# Patient Record
Sex: Male | Born: 1955 | Race: White | Hispanic: Yes | Marital: Married | Smoking: Former smoker
Health system: Southern US, Community
[De-identification: ages and names within clinical notes are randomized; demographics above are authoritative.]

## PROBLEM LIST (undated history)

## (undated) DIAGNOSIS — J45909 Unspecified asthma, uncomplicated: Secondary | ICD-10-CM

## (undated) DIAGNOSIS — F32A Depression, unspecified: Secondary | ICD-10-CM

## (undated) DIAGNOSIS — M1990 Unspecified osteoarthritis, unspecified site: Secondary | ICD-10-CM

## (undated) DIAGNOSIS — I509 Heart failure, unspecified: Secondary | ICD-10-CM

## (undated) DIAGNOSIS — I350 Nonrheumatic aortic (valve) stenosis: Secondary | ICD-10-CM

## (undated) DIAGNOSIS — E785 Hyperlipidemia, unspecified: Secondary | ICD-10-CM

## (undated) DIAGNOSIS — I10 Essential (primary) hypertension: Secondary | ICD-10-CM

## (undated) DIAGNOSIS — G4730 Sleep apnea, unspecified: Secondary | ICD-10-CM

## (undated) DIAGNOSIS — R011 Cardiac murmur, unspecified: Secondary | ICD-10-CM

## (undated) DIAGNOSIS — K219 Gastro-esophageal reflux disease without esophagitis: Secondary | ICD-10-CM

## (undated) HISTORY — DX: Essential (primary) hypertension: I10

## (undated) HISTORY — PX: CHOLECYSTECTOMY: SHX55

## (undated) HISTORY — DX: Morbid (severe) obesity due to excess calories: E66.01

## (undated) HISTORY — DX: Nonrheumatic aortic (valve) stenosis: I35.0

## (undated) HISTORY — PX: APPENDECTOMY: SHX54

## (undated) HISTORY — DX: Cardiac murmur, unspecified: R01.1

## (undated) HISTORY — DX: Sleep apnea, unspecified: G47.30

## (undated) HISTORY — DX: Hyperlipidemia, unspecified: E78.5

---

## 2005-10-08 ENCOUNTER — Ambulatory Visit: Payer: Self-pay | Admitting: Internal Medicine

## 2006-08-19 ENCOUNTER — Observation Stay: Payer: Self-pay | Admitting: Internal Medicine

## 2006-08-19 ENCOUNTER — Other Ambulatory Visit: Payer: Self-pay

## 2006-10-26 ENCOUNTER — Ambulatory Visit: Payer: Self-pay | Admitting: Gastroenterology

## 2007-11-23 ENCOUNTER — Ambulatory Visit: Payer: Self-pay | Admitting: Family Medicine

## 2010-02-10 ENCOUNTER — Ambulatory Visit: Payer: Self-pay

## 2011-09-13 DIAGNOSIS — E785 Hyperlipidemia, unspecified: Secondary | ICD-10-CM | POA: Insufficient documentation

## 2011-09-13 DIAGNOSIS — B353 Tinea pedis: Secondary | ICD-10-CM | POA: Insufficient documentation

## 2011-09-13 DIAGNOSIS — E669 Obesity, unspecified: Secondary | ICD-10-CM | POA: Insufficient documentation

## 2011-09-13 DIAGNOSIS — I10 Essential (primary) hypertension: Secondary | ICD-10-CM | POA: Insufficient documentation

## 2011-09-14 DIAGNOSIS — E559 Vitamin D deficiency, unspecified: Secondary | ICD-10-CM | POA: Insufficient documentation

## 2012-05-10 DIAGNOSIS — M1990 Unspecified osteoarthritis, unspecified site: Secondary | ICD-10-CM | POA: Insufficient documentation

## 2012-10-06 DIAGNOSIS — L989 Disorder of the skin and subcutaneous tissue, unspecified: Secondary | ICD-10-CM | POA: Insufficient documentation

## 2013-03-05 DIAGNOSIS — M5416 Radiculopathy, lumbar region: Secondary | ICD-10-CM | POA: Insufficient documentation

## 2013-09-03 DIAGNOSIS — R011 Cardiac murmur, unspecified: Secondary | ICD-10-CM | POA: Insufficient documentation

## 2013-09-10 DIAGNOSIS — E119 Type 2 diabetes mellitus without complications: Secondary | ICD-10-CM | POA: Insufficient documentation

## 2014-05-29 DIAGNOSIS — F3341 Major depressive disorder, recurrent, in partial remission: Secondary | ICD-10-CM | POA: Insufficient documentation

## 2017-03-09 ENCOUNTER — Ambulatory Visit: Payer: Medicaid Other | Attending: Neurology

## 2017-03-09 DIAGNOSIS — G4733 Obstructive sleep apnea (adult) (pediatric): Secondary | ICD-10-CM | POA: Insufficient documentation

## 2017-03-09 DIAGNOSIS — I10 Essential (primary) hypertension: Secondary | ICD-10-CM | POA: Insufficient documentation

## 2017-03-09 DIAGNOSIS — Z1389 Encounter for screening for other disorder: Secondary | ICD-10-CM | POA: Diagnosis present

## 2017-03-09 DIAGNOSIS — I252 Old myocardial infarction: Secondary | ICD-10-CM | POA: Insufficient documentation

## 2017-03-09 DIAGNOSIS — F329 Major depressive disorder, single episode, unspecified: Secondary | ICD-10-CM | POA: Diagnosis not present

## 2017-03-09 DIAGNOSIS — F5101 Primary insomnia: Secondary | ICD-10-CM | POA: Insufficient documentation

## 2017-03-09 DIAGNOSIS — G4730 Sleep apnea, unspecified: Secondary | ICD-10-CM | POA: Diagnosis present

## 2017-03-16 ENCOUNTER — Ambulatory Visit: Payer: Medicaid Other | Attending: Neurology

## 2017-03-16 DIAGNOSIS — G4733 Obstructive sleep apnea (adult) (pediatric): Secondary | ICD-10-CM | POA: Diagnosis not present

## 2017-03-16 DIAGNOSIS — F5101 Primary insomnia: Secondary | ICD-10-CM | POA: Diagnosis not present

## 2017-03-16 DIAGNOSIS — E119 Type 2 diabetes mellitus without complications: Secondary | ICD-10-CM | POA: Diagnosis not present

## 2017-09-13 ENCOUNTER — Encounter: Payer: Self-pay | Admitting: Cardiovascular Disease

## 2017-09-13 ENCOUNTER — Ambulatory Visit (INDEPENDENT_AMBULATORY_CARE_PROVIDER_SITE_OTHER): Payer: Medicaid Other | Admitting: Cardiovascular Disease

## 2017-09-13 VITALS — BP 126/76 | HR 76 | Ht 70.0 in | Wt 365.0 lb

## 2017-09-13 DIAGNOSIS — E785 Hyperlipidemia, unspecified: Secondary | ICD-10-CM | POA: Diagnosis not present

## 2017-09-13 DIAGNOSIS — I509 Heart failure, unspecified: Secondary | ICD-10-CM | POA: Diagnosis not present

## 2017-09-13 DIAGNOSIS — I10 Essential (primary) hypertension: Secondary | ICD-10-CM | POA: Diagnosis not present

## 2017-09-13 DIAGNOSIS — R011 Cardiac murmur, unspecified: Secondary | ICD-10-CM

## 2017-09-13 NOTE — Progress Notes (Signed)
  Cardiology Office Note   Date:  09/13/2017   ID:  Andre Erickson, DOB 11/22/1955, MRN [id number]  PCP:  Markly, Linda, PA  Cardiologist:   Muhammad Arida, MD   Chief Complaint  Patient presents with  . New Patient (Initial Visit)    urgent referral per [facility] murmur angina per daughter BLE swelling and weaping. Meds reviewed verbally with patient,.       History of Present Illness: Andre Erickson is a 62 y.o. male who was referred by Dr. Elena Adamo for evaluation of shortness of breath, leg edema and a cardiac murmur.  The patient is originally from [place] and speaks some English but his wife is present and helps with translation.  He has known history of morbid obesity, essential hypertension, hyperlipidemia and sleep apnea with poor intolerance to CPAP.  There is no family history of premature coronary artery disease although his father does have A. fib and had coronary artery disease later in life. According to the patient and his wife, he was hospitalized at [facility] in 2008 with what seems to be cardiac arrest from the description.  At that time he was seen by Dr. Kowalski and cardiac catheterization was recommended.  However, the patient declined after he heard about the risks and benefits.  Since then, the patient has been managed medically and has not been seen by cardiology.  Over the last year, he has experienced worsening exertional dyspnea which is currently happening with minimal activities.  He also had worsening leg edema and was started on furosemide 4 months ago with some improvement.  He occasionally forms blisters and weeping from his legs.  He does describe occasional chest discomfort with his dyspnea.   Past Medical History:  Diagnosis Date  . Hyperlipidemia   . Hypertension   . Morbid obesity (HCC)   . Murmur   . Sleep apnea     History reviewed. No pertinent surgical history.   Current Outpatient Medications  Medication Sig Dispense  Refill  . albuterol (PROVENTIL HFA;VENTOLIN HFA) 108 (90 Base) MCG/ACT inhaler Inhale 2 puffs into the lungs every 4 (four) hours as needed for wheezing or shortness of breath.    . aspirin 81 MG chewable tablet Chew 81 mg by mouth daily.    . diclofenac (VOLTAREN) 75 MG EC tablet Take 75 mg by mouth 2 (two) times daily.    . DULoxetine (CYMBALTA) 60 MG capsule Take 60 mg by mouth daily.    . furosemide (LASIX) 40 MG tablet Take 40 mg by mouth daily.    . gabapentin (NEURONTIN) 300 MG capsule Take 300 mg by mouth 2 (two) times daily.    . losartan (COZAAR) 100 MG tablet Take 100 mg by mouth daily.    . pravastatin (PRAVACHOL) 40 MG tablet Take 40 mg by mouth daily.     No current facility-administered medications for this visit.     Allergies:   Patient has no allergy information on record.    Social History:  The patient  reports that he has never smoked. He has never used smokeless tobacco. He reports that he drank alcohol. He reports that he does not use drugs.   Family History:  The patient's family history is remarkable for atrial fibrillation and coronary artery disease as outlined above   ROS:  Please see the history of present illness.   Otherwise, review of systems are positive for none.   All other systems are reviewed and negative.      PHYSICAL EXAM: VS:  BP 126/76 (BP Location: Left Arm, Patient Position: Sitting, Cuff Size: Large)   Pulse 76   Ht 5' 10" (1.778 m)   Wt (!) 365 lb (165.6 kg)   BMI 52.37 kg/m  , BMI Body mass index is 52.37 kg/m. GEN: Well nourished, well developed, in no acute distress  HEENT: normal  Neck: Jugular venous pressure is not well visualized, carotid bruits, or masses Cardiac: RRR; no  rubs, or gallops.  3 out of 6 crescendo decrescendo systolic murmur in the aortic area which is late peaking with very diminished S2.  There is moderate bilateral leg edema with chronic stasis dermatitis Respiratory:  clear to auscultation bilaterally, normal  work of breathing GI: soft, nontender, nondistended, + BS MS: no deformity or atrophy  Skin: warm and dry, no rash Neuro:  Strength and sensation are intact Psych: euthymic mood, full affect   EKG:  EKG is ordered today. The ekg ordered today demonstrates normal sinus rhythm with left atrial enlargement, left anterior fascicular block and lateral T wave changes suggestive of ischemia.   Recent Labs: No results found for requested labs within last 8760 hours.    Lipid Panel No results found for: CHOL, TRIG, HDL, CHOLHDL, VLDL, LDLCALC, LDLDIRECT    Wt Readings from Last 3 Encounters:  09/13/17 (!) 365 lb (165.6 kg)       No flowsheet data found.    ASSESSMENT AND PLAN:  1.  Chronic heart failure: Unknown if diastolic or systolic.  The patient appears to be mildly volume overloaded but he has improved with current dose of furosemide 40 mg once daily.  I made no changes in his medications until we obtain an echocardiogram.  2.  Cardiac murmur suggestive of aortic stenosis: The patient is aware of prolonged history of a heart murmur and thus bicuspid aortic valve is a possibility.  The patient most likely will require a right and left cardiac catheterization given his significant exertional dyspnea which has worsened over the last few months.  Nonetheless, the patient is very hesitant about invasive work-up due to fear of complications.  3.  Essential hypertension: Blood pressure is well controlled on current medications.  4.  Hyperlipidemia: Currently on pravastatin.  5.  Morbid obesity: He reports that he has been able to lose 12 pounds over the last year with healthier diet.    Disposition:   FU in 1 month  Signed,  Muhammad Arida, MD  09/13/2017 3:49 PM    [facility] 

## 2017-09-13 NOTE — Patient Instructions (Signed)
Medication Instructions: Your physician recommends that you continue on your current medications as directed. Please refer to the Current Medication list given to you today.  If you need a refill on your cardiac medications before your next appointment, please call your pharmacy.   Procedures/Testing: Your physician has requested that you have an echocardiogram. Echocardiography is a painless test that uses sound waves to create images of your heart. It provides your doctor with information about the size and shape of your heart and how well your heart's chambers and valves are working. You may receive an ultrasound enhancing agent through an IV if needed to better visualize your heart during the echo.This procedure takes approximately one hour. There are no restrictions for this procedure. This will take place at the [facility].    Follow-Up: Your physician wants you to follow-up in one month with Dr. Kirke CorinArida or an APP.   Thank you for choosing [facility] at [facility]!

## 2017-09-13 NOTE — H&P (View-Only) (Signed)
Cardiology Office Note   Date:  09/13/2017   ID:  Andre Erickson, DOB [birth year], MRN [id number]  PCP:  Dala DockMarkly, Linda, PA  Cardiologist:   Lorine BearsMuhammad Lanora Reveron, MD   Chief Complaint  Patient presents with  . New Patient (Initial Visit)    urgent [facility] angina per daughter BLE swelling and weaping. Meds reviewed verbally with patient,.       History of Present Illness: Andre Erickson is a 62 y.o. male who was referred by Dr. Beverely LowElena Adamo for evaluation of shortness of breath, leg edema and a cardiac murmur.  The patient is originally from [place] and speaks some English but his wife is present and helps with translation.  He has known history of morbid obesity, essential hypertension, hyperlipidemia and sleep apnea with poor intolerance to CPAP.  There is no family history of premature coronary artery disease although his father does have A. fib and had coronary artery disease later in life. According to the patient and his wife, he was hospitalized at [facility] in 2008 with what seems to be cardiac arrest from the description.  At that time he was seen by Dr. Gwen PoundsKowalski and cardiac catheterization was recommended.  However, the patient declined after he heard about the risks and benefits.  Since then, the patient has been managed medically and has not been seen by cardiology.  Over the last year, he has experienced worsening exertional dyspnea which is currently happening with minimal activities.  He also had worsening leg edema and was started on furosemide 4 months ago with some improvement.  He occasionally forms blisters and weeping from his legs.  He does describe occasional chest discomfort with his dyspnea.   Past Medical History:  Diagnosis Date  . Hyperlipidemia   . Hypertension   . Morbid obesity (HCC)   . Murmur   . Sleep apnea     History reviewed. No pertinent surgical history.   Current Outpatient Medications  Medication Sig Dispense  Refill  . albuterol (PROVENTIL HFA;VENTOLIN HFA) 108 (90 Base) MCG/ACT inhaler Inhale 2 puffs into the lungs every 4 (four) hours as needed for wheezing or shortness of breath.    Marland Kitchen. aspirin 81 MG chewable tablet Chew 81 mg by mouth daily.    . diclofenac (VOLTAREN) 75 MG EC tablet Take 75 mg by mouth 2 (two) times daily.    . DULoxetine (CYMBALTA) 60 MG capsule Take 60 mg by mouth daily.    . furosemide (LASIX) 40 MG tablet Take 40 mg by mouth daily.    Marland Kitchen. gabapentin (NEURONTIN) 300 MG capsule Take 300 mg by mouth 2 (two) times daily.    Marland Kitchen. losartan (COZAAR) 100 MG tablet Take 100 mg by mouth daily.    . pravastatin (PRAVACHOL) 40 MG tablet Take 40 mg by mouth daily.     No current facility-administered medications for this visit.     Allergies:   Patient has no allergy information on record.    Social History:  The patient  reports that he has never smoked. He has never used smokeless tobacco. He reports that he drank alcohol. He reports that he does not use drugs.   Family History:  The patient's family history is remarkable for atrial fibrillation and coronary artery disease as outlined above   ROS:  Please see the history of present illness.   Otherwise, review of systems are positive for none.   All other systems are reviewed and negative.  PHYSICAL EXAM: VS:  BP 126/76 (BP Location: Left Arm, Patient Position: Sitting, Cuff Size: Large)   Pulse 76   Ht 5\' 10"  (1.778 m)   Wt (!) 365 lb (165.6 kg)   BMI 52.37 kg/m  , BMI Body mass index is 52.37 kg/m. GEN: Well nourished, well developed, in no acute distress  HEENT: normal  Neck: Jugular venous pressure is not well visualized, carotid bruits, or masses Cardiac: RRR; no  rubs, or gallops.  3 out of 6 crescendo decrescendo systolic murmur in the aortic area which is late peaking with very diminished S2.  There is moderate bilateral leg edema with chronic stasis dermatitis Respiratory:  clear to auscultation bilaterally, normal  work of breathing GI: soft, nontender, nondistended, + BS MS: no deformity or atrophy  Skin: warm and dry, no rash Neuro:  Strength and sensation are intact Psych: euthymic mood, full affect   EKG:  EKG is ordered today. The ekg ordered today demonstrates normal sinus rhythm with left atrial enlargement, left anterior fascicular block and lateral T wave changes suggestive of ischemia.   Recent Labs: No results found for requested labs within last 8760 hours.    Lipid Panel No results found for: CHOL, TRIG, HDL, CHOLHDL, VLDL, LDLCALC, LDLDIRECT    Wt Readings from Last 3 Encounters:  09/13/17 (!) 365 lb (165.6 kg)       No flowsheet data found.    ASSESSMENT AND PLAN:  1.  Chronic heart failure: Unknown if diastolic or systolic.  The patient appears to be mildly volume overloaded but he has improved with current dose of furosemide 40 mg once daily.  I made no changes in his medications until we obtain an echocardiogram.  2.  Cardiac murmur suggestive of aortic stenosis: The patient is aware of prolonged history of a heart murmur and thus bicuspid aortic valve is a possibility.  The patient most likely will require a right and left cardiac catheterization given his significant exertional dyspnea which has worsened over the last few months.  Nonetheless, the patient is very hesitant about invasive work-up due to fear of complications.  3.  Essential hypertension: Blood pressure is well controlled on current medications.  4.  Hyperlipidemia: Currently on pravastatin.  5.  Morbid obesity: He reports that he has been able to lose 12 pounds over the last year with healthier diet.    Disposition:   FU in 1 month  Signed,  Lorine Bears, MD  09/13/2017 3:49 PM    [facility]

## 2017-09-21 ENCOUNTER — Other Ambulatory Visit: Payer: Self-pay

## 2017-09-21 ENCOUNTER — Ambulatory Visit (INDEPENDENT_AMBULATORY_CARE_PROVIDER_SITE_OTHER): Payer: Medicaid Other

## 2017-09-21 DIAGNOSIS — R011 Cardiac murmur, unspecified: Secondary | ICD-10-CM | POA: Diagnosis not present

## 2017-09-21 MED ORDER — PERFLUTREN LIPID MICROSPHERE
1.0000 mL | INTRAVENOUS | Status: AC | PRN
  Administered 2017-09-21: 2 mL via INTRAVENOUS

## 2017-09-22 ENCOUNTER — Telehealth: Payer: Self-pay | Admitting: *Deleted

## 2017-09-22 DIAGNOSIS — Z01818 Encounter for other preprocedural examination: Secondary | ICD-10-CM

## 2017-09-22 DIAGNOSIS — I350 Nonrheumatic aortic (valve) stenosis: Secondary | ICD-10-CM

## 2017-09-22 NOTE — Telephone Encounter (Signed)
Left a message to call back.

## 2017-09-22 NOTE — Telephone Encounter (Signed)
-----   Message from Iran Ouch, MD sent at 09/22/2017 12:56 PM EDT ----- Inform patient that echo showed reduced EF at 30 to 35% with severe aortic valve stenosis.   The patient will need surgery to replace his aortic valve but before that we have to do a right and left cardiac catheterization. I recommend a right and left cardiac catheterization to be done on Monday.

## 2017-09-23 ENCOUNTER — Encounter: Payer: Self-pay | Admitting: *Deleted

## 2017-09-23 ENCOUNTER — Other Ambulatory Visit
Admission: RE | Admit: 2017-09-23 | Discharge: 2017-09-23 | Disposition: A | Discharge status: Home / Self Care | Payer: Medicaid Other | Source: Ambulatory Visit | Attending: Cardiovascular Disease | Admitting: Cardiovascular Disease

## 2017-09-23 DIAGNOSIS — Z01818 Encounter for other preprocedural examination: Secondary | ICD-10-CM | POA: Insufficient documentation

## 2017-09-23 DIAGNOSIS — I350 Nonrheumatic aortic (valve) stenosis: Secondary | ICD-10-CM

## 2017-09-23 LAB — BASIC METABOLIC PANEL
Anion gap: 9 (ref 5–15)
BUN: 21 mg/dL — ABNORMAL HIGH (ref 6–20)
CALCIUM: 8.7 mg/dL — AB (ref 8.9–10.3)
CO2: 29 mmol/L (ref 22–32)
CREATININE: 1 mg/dL (ref 0.61–1.24)
Chloride: 101 mmol/L (ref 98–111)
GFR calc non Af Amer: >60 mL/min (ref >60)
Glucose, Bld: 99 mg/dL (ref 70–99)
Potassium: 4.2 mmol/L (ref 3.5–5.1)
SODIUM: 139 mmol/L (ref 135–145)

## 2017-09-23 LAB — CBC
HEMATOCRIT: 45.3 % (ref 40.0–52.0)
Hemoglobin: 15.5 g/dL (ref 13.0–18.0)
MCH: 30.4 pg (ref 26.0–34.0)
MCHC: 34.2 g/dL (ref 32.0–36.0)
MCV: 89.1 fL (ref 80.0–100.0)
Platelets: 226 10*3/uL (ref 150–440)
RBC: 5.09 MIL/uL (ref 4.40–5.90)
RDW: 13.8 % (ref 11.5–14.5)
WBC: 6.7 10*3/uL (ref 3.8–10.6)

## 2017-09-23 NOTE — Telephone Encounter (Signed)
Call placed to the patient and his wife. They have both verbalized their understanding of the echo results and the patient has agreed to have the left/right heart catheterization with Dr. Kirke Corin on Monday 09/26/17 at 9:30.  Lab orders have been placed. [facility] done now.  A letter of instructions has been left for the patient at the front desk. Instructions have also been gone over on the phone with both the patient and the wife. He will hold his Lasix the morning of the procedure and take his 81 mg Aspirin that morning along with his other medications.

## 2017-09-26 ENCOUNTER — Ambulatory Visit
Admission: RE | Admit: 2017-09-26 | Discharge: 2017-09-26 | Disposition: A | Discharge status: Home / Self Care | Payer: Medicaid Other | Source: Ambulatory Visit | Attending: Cardiovascular Disease | Admitting: Cardiovascular Disease

## 2017-09-26 ENCOUNTER — Encounter
Admission: RE | Disposition: A | Discharge status: Home / Self Care | Payer: Self-pay | Source: Ambulatory Visit | Attending: Cardiovascular Disease

## 2017-09-26 DIAGNOSIS — Z7982 Long term (current) use of aspirin: Secondary | ICD-10-CM | POA: Insufficient documentation

## 2017-09-26 DIAGNOSIS — E785 Hyperlipidemia, unspecified: Secondary | ICD-10-CM | POA: Insufficient documentation

## 2017-09-26 DIAGNOSIS — I10 Essential (primary) hypertension: Secondary | ICD-10-CM | POA: Diagnosis not present

## 2017-09-26 DIAGNOSIS — R0602 Shortness of breath: Secondary | ICD-10-CM | POA: Diagnosis not present

## 2017-09-26 DIAGNOSIS — I350 Nonrheumatic aortic (valve) stenosis: Secondary | ICD-10-CM | POA: Diagnosis present

## 2017-09-26 DIAGNOSIS — Z8249 Family history of ischemic heart disease and other diseases of the circulatory system: Secondary | ICD-10-CM | POA: Diagnosis not present

## 2017-09-26 DIAGNOSIS — Z6841 Body Mass Index (BMI) 40.0 and over, adult: Secondary | ICD-10-CM | POA: Diagnosis not present

## 2017-09-26 DIAGNOSIS — I272 Pulmonary hypertension, unspecified: Secondary | ICD-10-CM | POA: Insufficient documentation

## 2017-09-26 DIAGNOSIS — G4730 Sleep apnea, unspecified: Secondary | ICD-10-CM | POA: Diagnosis not present

## 2017-09-26 DIAGNOSIS — Z79899 Other long term (current) drug therapy: Secondary | ICD-10-CM | POA: Diagnosis not present

## 2017-09-26 HISTORY — PX: CARDIAC CATHETERIZATION: SHX172

## 2017-09-26 SURGERY — RIGHT HEART CATH AND CORONARY ANGIOGRAPHY
Anesthesia: Moderate Sedation

## 2017-09-26 MED ORDER — HEPARIN SODIUM (PORCINE) 1000 UNIT/ML IJ SOLN
INTRAMUSCULAR | Status: AC
  Filled 2017-09-26: qty 1

## 2017-09-26 MED ORDER — SODIUM CHLORIDE 0.9% FLUSH: 3.0000 mL | INTRAVENOUS | Status: DC | PRN

## 2017-09-26 MED ORDER — SODIUM CHLORIDE 0.9% FLUSH: 3.0000 mL | Freq: Two times a day (BID) | INTRAVENOUS | Status: DC

## 2017-09-26 MED ORDER — SODIUM CHLORIDE 0.9 % IV SOLN: 250.0000 mL | INTRAVENOUS | Status: DC | PRN

## 2017-09-26 MED ORDER — HEPARIN SODIUM (PORCINE) 1000 UNIT/ML IJ SOLN
INTRAMUSCULAR | Status: DC | PRN
  Administered 2017-09-26: 8000 [IU] via INTRAVENOUS

## 2017-09-26 MED ORDER — ONDANSETRON HCL 4 MG/2ML IJ SOLN: 4.0000 mg | Freq: Four times a day (QID) | INTRAMUSCULAR | Status: DC | PRN

## 2017-09-26 MED ORDER — ACETAMINOPHEN 325 MG PO TABS: 650.0000 mg | ORAL_TABLET | ORAL | Status: DC | PRN

## 2017-09-26 MED ORDER — SODIUM CHLORIDE 0.9 % IV SOLN
INTRAVENOUS | Status: DC
  Administered 2017-09-26: 10:00:00 via INTRAVENOUS

## 2017-09-26 MED ORDER — FENTANYL CITRATE (PF) 100 MCG/2ML IJ SOLN
INTRAMUSCULAR | Status: AC
  Filled 2017-09-26: qty 2

## 2017-09-26 MED ORDER — ASPIRIN 81 MG PO CHEW: 81.0000 mg | CHEWABLE_TABLET | ORAL | Status: DC

## 2017-09-26 MED ORDER — MIDAZOLAM HCL 2 MG/2ML IJ SOLN
INTRAMUSCULAR | Status: AC
  Filled 2017-09-26: qty 2

## 2017-09-26 MED ORDER — IOPAMIDOL (ISOVUE-300) INJECTION 61%
INTRAVENOUS | Status: DC | PRN
  Administered 2017-09-26: 80 mL via INTRA_ARTERIAL

## 2017-09-26 MED ORDER — MIDAZOLAM HCL 2 MG/2ML IJ SOLN
INTRAMUSCULAR | Status: DC | PRN
  Administered 2017-09-26: 1 mg via INTRAVENOUS

## 2017-09-26 MED ORDER — FENTANYL CITRATE (PF) 100 MCG/2ML IJ SOLN
INTRAMUSCULAR | Status: DC | PRN
  Administered 2017-09-26: 50 ug via INTRAVENOUS

## 2017-09-26 MED ORDER — FUROSEMIDE 40 MG PO TABS: 40.0000 mg | ORAL_TABLET | Freq: Two times a day (BID) | ORAL | 3 refills | Status: DC

## 2017-09-26 MED ORDER — POTASSIUM CHLORIDE CRYS ER 20 MEQ PO TBCR: 20.0000 meq | EXTENDED_RELEASE_TABLET | Freq: Every day | ORAL | 3 refills | Status: DC

## 2017-09-26 MED ORDER — VERAPAMIL HCL 2.5 MG/ML IV SOLN
INTRAVENOUS | Status: AC
  Filled 2017-09-26: qty 2

## 2017-09-26 SURGICAL SUPPLY — 10 items
CATH BALLN WEDGE 5F 110CM (CATHETERS) ×1 IMPLANT
CATH INFINITI 5FR JK (CATHETERS) ×1 IMPLANT
CATH INFINITI JR4 5F (CATHETERS) ×1 IMPLANT
DEVICE RAD COMP TR BAND LRG (VASCULAR PRODUCTS) ×1 IMPLANT
GLIDESHEATH SLEND SS 6F .021 (SHEATH) ×1 IMPLANT
KIT MANI 3VAL PERCEP (MISCELLANEOUS) ×2 IMPLANT
KIT RIGHT HEART (MISCELLANEOUS) ×2 IMPLANT
PACK CARDIAC CATH (CUSTOM PROCEDURE TRAY) ×2 IMPLANT
SHEATH RAIN 4/5FR (SHEATH) ×1 IMPLANT
WIRE ROSEN-J .035X260CM (WIRE) ×1 IMPLANT

## 2017-09-26 NOTE — Progress Notes (Signed)
Dr. Kirke Corin at bedside now speaking with pt. And spouse extensively re: cath procedure. Both verbalize understanding of procedure.

## 2017-09-26 NOTE — Interval H&P Note (Signed)
History and Physical Interval Note: The patient had an echocardiogram done which showed an EF of 35 to 40%, bicuspid aortic valve with severe aortic stenosis.  Mean aortic valve gradient was at least 54 mmHg and in some views was greater than 60 mmHg with a valve area of 0.6 cm.  The ascending aorta was 3.8 cm in diameter.  Given severe aortic stenosis with cardiomyopathy, the patient is here for a right and left cardiac catheterization before aortic valve replacement.  09/26/2017 11:47 AM  Andre Erickson  has presented today for surgery, with the diagnosis of LT RT Heart Cath   Severe Aortic Stenosis  The various methods of treatment have been discussed with the patient and family. After consideration of risks, benefits and other options for treatment, the patient has consented to  Procedure(s): RIGHT/LEFT HEART CATH AND CORONARY ANGIOGRAPHY (N/A) as a surgical intervention .  The patient's history has been reviewed, patient examined, no change in status, stable for surgery.  I have reviewed the patient's chart and labs.  Questions were answered to the patient's satisfaction.     Lorine Bears

## 2017-09-26 NOTE — Progress Notes (Signed)
Called and spoke with wife re: post cath instructions, DC instructions, med changes and MD follow-ups. Wife Lanora Manis verbalized understanding. Printed copy of all instructions given to pt. Pt. Wife to pick up patient at front of hospital in 20-30 min.

## 2017-09-26 NOTE — Discharge Instructions (Signed)
° °  [facility] cardiology surgeons will call you with appointment regarding valve surgery.   Radial Site Care Refer to this sheet in the next few weeks. These instructions provide you with information about caring for yourself after your procedure. Your health care provider may also give you more specific instructions. Your treatment has been planned according to current medical practices, but problems sometimes occur. Call your health care provider if you have any problems or questions after your procedure. What can I expect after the procedure? After your procedure, it is typical to have the following:  Bruising at the radial site that usually fades within 1-2 weeks.  Blood collecting in the tissue (hematoma) that may be painful to the touch. It should usually decrease in size and tenderness within 1-2 weeks.  Follow these instructions at home:  Take medicines only as directed by your health care provider.  You may shower 24-48 hours after the procedure or as directed by your health care provider. Remove the bandage (dressing) and gently wash the site with plain soap and water. Pat the area dry with a clean towel. Do not rub the site, because this may cause bleeding.  Do not take baths, swim, or use a hot tub until your health care provider approves.  Check your insertion site every day for redness, swelling, or drainage.  Do not apply powder or lotion to the site.  Do not flex or bend the affected arm for 24 hours or as directed by your health care provider.  Do not push or pull heavy objects with the affected arm for 24 hours or as directed by your health care provider.  Do not lift over 10 lb (4.5 kg) for 5 days after your procedure or as directed by your health care provider.  Ask your health care provider when it is okay to: ? Return to work or school. ? Resume usual physical activities or sports. ? Resume sexual activity.  Do not drive home if you are discharged the same day  as the procedure. Have someone else drive you.  You may drive 24 hours after the procedure unless otherwise instructed by your health care provider.  Do not operate machinery or power tools for 24 hours after the procedure.  If your procedure was done as an outpatient procedure, which means that you went home the same day as your procedure, a responsible adult should be with you for the first 24 hours after you arrive home.  Keep all follow-up visits as directed by your health care provider. This is important. Contact a health care provider if:  You have a fever.  You have chills.  You have increased bleeding from the radial site. Hold pressure on the site. Get help right away if:  You have unusual pain at the radial site.  You have redness, warmth, or swelling at the radial site.  You have drainage (other than a small amount of blood on the dressing) from the radial site.  The radial site is bleeding, and the bleeding does not stop after 30 minutes of holding steady pressure on the site.  Your arm or hand becomes pale, cool, tingly, or numb. This information is not intended to replace advice given to you by your health care provider. Make sure you discuss any questions you have with your health care provider. Document Released: 02/06/2010 Document Revised: 06/12/2015 Document Reviewed: 07/23/2013 Elsevier Interactive Patient Education  2018 [employer].

## 2017-09-28 ENCOUNTER — Telehealth: Payer: Self-pay | Admitting: *Deleted

## 2017-09-28 DIAGNOSIS — I10 Essential (primary) hypertension: Secondary | ICD-10-CM

## 2017-09-28 NOTE — Telephone Encounter (Signed)
Patient called and informed that he would need a BMET drawn next week. He has an appointment on 9/16 to have his labs drawn. Orders have been placed.

## 2017-09-28 NOTE — Telephone Encounter (Addendum)
-----   Message from Iran Ouch, MD sent at 09/26/2017  1:45 PM EDT ----- I increased his furosemide to 40 mg twice daily and added potassium chloride 20 mEq once daily today after cath.  He needs basic metabolic profile in 1 week.

## 2017-09-29 ENCOUNTER — Encounter: Payer: Self-pay | Admitting: Thoracic Surgery (Cardiothoracic Vascular Surgery)

## 2017-09-29 ENCOUNTER — Institutional Professional Consult (permissible substitution): Payer: Medicaid Other | Admitting: Thoracic Surgery (Cardiothoracic Vascular Surgery)

## 2017-09-29 VITALS — BP 124/85 | HR 74 | Resp 20 | Ht 70.0 in | Wt 356.0 lb

## 2017-09-29 DIAGNOSIS — Q231 Congenital insufficiency of aortic valve: Secondary | ICD-10-CM | POA: Diagnosis not present

## 2017-09-29 DIAGNOSIS — I350 Nonrheumatic aortic (valve) stenosis: Secondary | ICD-10-CM | POA: Diagnosis not present

## 2017-09-29 NOTE — Progress Notes (Signed)
PCP is Dala Dock, [place] Referring Provider is Iran Ouch, MD  Chief Complaint  Patient presents with  . Aortic Stenosis    Surgical eval, Cardiac Cath 09/26/17, ECHO 09/21/17    HPI: Mr. Camarena is a 62 year old Hispanic male with a past medical history significant for hypertension, hyperlipidemia, obstructive sleep apnea requiring CPAP, morbid obesity, and a long-standing heart murmur.  He saw Dr. Richarda Blade recently with complaints of shortness of breath and swelling in his legs.  He is [place] and speaks some [place].  His wife is with him and speaks better [place].  He was evaluated back in 2008.  Cardiac catheterization was recommended but he refused it due to concern about the risks.  Over the past year he has had worsening shortness of breath.  Originally this was only exertional however recently he has also had shortness of breath at rest.  He is noted leg swelling at times to the point where his skin blisters and he weeps from his legs.  That has improved recently with the addition of Lasix.  He says that he is felt short of breath and thought that might be due to his morbid obesity.  He tried to exercise but when he exerts himself he feels more short of breath and also gets tightness or pressure in his chest.  He was referred to Dr. Kirke Corin.  An echocardiogram showed critical aortic stenosis with a valve area of 0.6 cm with a mean gradient of 58 and peak gradient of 93 mmHg.  He underwent cardiac catheterization which showed no significant coronary artery disease.   Past Medical History:  Diagnosis Date  . Hyperlipidemia   . Hypertension   . Morbid obesity (HCC)   . Murmur   . Sleep apnea     Past Surgical History:  Procedure Laterality Date  . APPENDECTOMY    . CHOLECYSTECTOMY      History reviewed. No pertinent family history.  Social History Social History   Tobacco Use  . Smoking status: Former Smoker    Last attempt to quit: 09/27/1978   Years since quitting: 39.0  . Smokeless tobacco: Never Used  Substance Use Topics  . Alcohol use: Not Currently  . Drug use: Never    Current Outpatient Medications  Medication Sig Dispense Refill  . albuterol (PROVENTIL HFA;VENTOLIN HFA) 108 (90 Base) MCG/ACT inhaler Inhale 2 puffs into the lungs every 4 (four) hours as needed for wheezing or shortness of breath.    Marland Kitchen aspirin 81 MG tablet Take 81 mg by mouth daily.     . Cholecalciferol (VITAMIN D-3) 5000 units TABS Take 5,000 Units by mouth daily.    . diclofenac (VOLTAREN) 75 MG EC tablet Take 75 mg by mouth daily.     . DULoxetine (CYMBALTA) 60 MG capsule Take 60 mg by mouth daily.    . furosemide (LASIX) 40 MG tablet Take 1 tablet (40 mg total) by mouth 2 (two) times daily. 60 tablet 3  . gabapentin (NEURONTIN) 300 MG capsule Take 300 mg by mouth 2 (two) times daily as needed (pain).     Marland Kitchen losartan (COZAAR) 100 MG tablet Take 100 mg by mouth daily.    . potassium chloride SA (K-DUR,KLOR-CON) 20 MEQ tablet Take 1 tablet (20 mEq total) by mouth daily. 30 tablet 3  . pravastatin (PRAVACHOL) 40 MG tablet Take 40 mg by mouth daily.     No current facility-administered medications for this visit.     No Known  Allergies  Review of Systems  Constitutional: Positive for activity change and fatigue. Negative for appetite change and unexpected weight change.  HENT: Positive for dental problem. Negative for trouble swallowing and voice change.   Eyes: Negative for visual disturbance.  Respiratory: Positive for apnea and shortness of breath.   Cardiovascular: Positive for chest pain and leg swelling.  Genitourinary: Negative for difficulty urinating and dysuria.  Musculoskeletal: Positive for arthralgias, gait problem and joint swelling.  Skin: Positive for wound (Leg ulcers with swelling).  Neurological: Positive for dizziness. Negative for syncope and weakness.  All other systems reviewed and are negative.   BP 124/85   Pulse 74    Resp 20   Ht 5\' 10"  (1.778 m)   Wt (!) 356 lb (161.5 kg)   SpO2 96% Comment: RA  BMI 51.08 kg/m  Physical Exam  Constitutional: He is oriented to person, place, and time. No distress.  Morbidly obese  HENT:  Head: Normocephalic and atraumatic.  Mouth/Throat: No oropharyngeal exudate.  Eyes: Pupils are equal, round, and reactive to light. Conjunctivae and EOM are normal. No scleral icterus.  Neck: Neck supple. No thyromegaly present.  Transmitted murmur bilaterally  Cardiovascular: Normal rate and regular rhythm.  Murmur (2/6 crescendo decrescendo throughout precordium) heard. Pulmonary/Chest: Effort normal. No respiratory distress. He has no wheezes. He has no rales.  Abdominal: Soft. There is no tenderness.  Musculoskeletal: He exhibits edema (3+).  Lymphadenopathy:    He has no cervical adenopathy.  Neurological: He is alert and oriented to person, place, and time. No cranial nerve deficit. He exhibits normal muscle tone. Coordination normal.  Vitals reviewed.    Diagnostic Tests: Echocardiogram 09/21/2017 Study Conclusions  - Left ventricle: The cavity size was mildly dilated. There was   moderate concentric hypertrophy. Systolic function was moderately   to severely reduced. The estimated ejection fraction was in the   range of 30% to 35%. Diffuse hypokinesis. Regional wall motion   abnormalities cannot be excluded. Features are consistent with a   pseudonormal left ventricular filling pattern, with concomitant   abnormal relaxation and increased filling pressure (grade 2   diastolic dysfunction). - Aortic valve: Severely calcified leaflets. Unable to exclude   bicuspid aortic valve. Transvalvular velocity was increased.   There was critical stenosis. There was mild regurgitation. Peak   velocity (S): 481 cm/s. Mean gradient (S): 54 mm Hg. Peak   gradient (S): 93 mm Hg. Valve area (VTI): 0.62 cm^2. - Left atrium: The atrium was moderately dilated. - Right ventricle:  Systolic function was normal. - Pulmonary arteries: Systolic pressure could not be accurately   estimated. Cardiac catheterization 09/26/2016 1.  Minimal luminal irregularities with no evidence of obstructive coronary artery disease.  Difficulty filling the left coronary system likely due to severe aortic stenosis and suboptimal visualization due to morbid obesity. 2.  Right heart catheterization showed moderately to severely elevated filling pressures, moderate pulmonary hypertension and normal cardiac output.  RA pressure: 14 / 18 mmHg, RV pressure 56 / 10 mmHg, PA pressure 58/29 mmHg, pulmonary capillary wedge pressure is 31 mmHg, cardiac output is 7.44 with a cardiac index of 2.76.  Recommendations: I did not attempt to cross the aortic valve.  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 Recommend aortic valve replacement. The patient is also significantly volume overloaded and I increase his furosemide to 40 mg once daily.  We will check basic metabolic profile in 1 week.  I personally reviewed the  echo and cardiac catheterization images and concur with the findings noted above  Impression: Mr. Fidalgo is a 62 year old man with a past medical history significant for morbid obesity, heart murmur, obstructive sleep apnea requiring CPAP, hypertension, and hyperlipidemia.  He presents with a chief complaint of shortness of breath.  He has shortness of breath with minimal exertion and sometimes even at rest.  He also has exertional chest tightness consistent with angina.  He has had presyncope but no frank syncope.  He had times has had severe swelling in his legs to the point of ulceration.  That has improved with Lasix.  He has critical aortic stenosis with an estimated valve area of approximately 0.6 cm.  He has a mean gradient of 58 and a peak gradient of 93 mmHg by echo.  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  Aortic valve replacement is indicated for survival benefit and relief of symptoms.  He would be a high risk patient for open aortic valve replacement due to his morbid obesity and deconditioning.  He would potentially benefit from TAVR.   He needs a cardiac gated CT angiogram of the chest, abdomen and pelvis to assess for vascular access for TAVR.  He will then follow-up with one of our TAVR surgeons, Dr. Laneta Simmers or Dr. Cornelius Moras for further evaluation.  He has poor dentition and has not seen a dentist in many years.  He needs an orthopantogram and a dental evaluation prior to any aortic valve intervention.  We will see if we can arrange an appointment with Dr. Robin Searing.   Plan: Orthopantogram Dental evaluation Cardiac gated CT angiogram of chest abdomen and pelvis Follow-up in valve clinic for consideration for TAVR  Loreli Slot, MD [facility] [phone]

## 2017-10-03 ENCOUNTER — Other Ambulatory Visit
Admission: RE | Admit: 2017-10-03 | Discharge: 2017-10-03 | Disposition: A | Discharge status: Home / Self Care | Payer: Medicaid Other | Source: Ambulatory Visit | Attending: Cardiovascular Disease | Admitting: Cardiovascular Disease

## 2017-10-03 ENCOUNTER — Other Ambulatory Visit: Payer: Medicaid Other

## 2017-10-03 DIAGNOSIS — I10 Essential (primary) hypertension: Secondary | ICD-10-CM | POA: Insufficient documentation

## 2017-10-03 LAB — BASIC METABOLIC PANEL
ANION GAP: 5 (ref 5–15)
BUN: 18 mg/dL (ref 6–20)
CALCIUM: 8.8 mg/dL — AB (ref 8.9–10.3)
CO2: 33 mmol/L — ABNORMAL HIGH (ref 22–32)
Chloride: 100 mmol/L (ref 98–111)
Creatinine, Ser: 0.88 mg/dL (ref 0.61–1.24)
GFR calc Af Amer: >60 mL/min (ref >60)
GLUCOSE: 117 mg/dL — AB (ref 70–99)
POTASSIUM: 4.3 mmol/L (ref 3.5–5.1)
SODIUM: 138 mmol/L (ref 135–145)

## 2017-10-04 ENCOUNTER — Telehealth: Payer: Self-pay | Admitting: *Deleted

## 2017-10-04 NOTE — Telephone Encounter (Signed)
Patient's wife made aware of results and verbalized understanding, per dpr.  She stated that the patient had a rash on the inner elbow area that had a few whelps on it. She has been advised to go to the PCP if it worsens to have it assessed.

## 2017-10-04 NOTE — Telephone Encounter (Signed)
-----   Message from Iran OuchMuhammad A Arida, MD sent at 10/03/2017  2:13 PM EDT ----- Inform patient that labs were stable after increasing furosemide.

## 2017-10-05 ENCOUNTER — Other Ambulatory Visit: Payer: Self-pay

## 2017-10-05 DIAGNOSIS — R0602 Shortness of breath: Secondary | ICD-10-CM

## 2017-10-05 DIAGNOSIS — I350 Nonrheumatic aortic (valve) stenosis: Secondary | ICD-10-CM

## 2017-10-05 DIAGNOSIS — Q231 Congenital insufficiency of aortic valve: Secondary | ICD-10-CM

## 2017-10-10 ENCOUNTER — Ambulatory Visit (HOSPITAL_COMMUNITY): Payer: Self-pay | Admitting: Dentistry

## 2017-10-10 ENCOUNTER — Encounter (HOSPITAL_COMMUNITY): Payer: Self-pay | Admitting: Dentistry

## 2017-10-10 VITALS — BP 126/83 | HR 90 | Temp 98.4°F

## 2017-10-10 DIAGNOSIS — K08409 Partial loss of teeth, unspecified cause, unspecified class: Secondary | ICD-10-CM

## 2017-10-10 DIAGNOSIS — K06010 Localized gingival recession, unspecified: Secondary | ICD-10-CM

## 2017-10-10 DIAGNOSIS — K036 Deposits [accretions] on teeth: Secondary | ICD-10-CM

## 2017-10-10 DIAGNOSIS — M2632 Excessive spacing of fully erupted teeth: Secondary | ICD-10-CM

## 2017-10-10 DIAGNOSIS — K0889 Other specified disorders of teeth and supporting structures: Secondary | ICD-10-CM

## 2017-10-10 DIAGNOSIS — M270 Developmental disorders of jaws: Secondary | ICD-10-CM

## 2017-10-10 DIAGNOSIS — K0530 Chronic periodontitis, unspecified: Secondary | ICD-10-CM

## 2017-10-10 DIAGNOSIS — K029 Dental caries, unspecified: Secondary | ICD-10-CM | POA: Diagnosis not present

## 2017-10-10 DIAGNOSIS — M264 Malocclusion, unspecified: Secondary | ICD-10-CM | POA: Diagnosis not present

## 2017-10-10 DIAGNOSIS — I350 Nonrheumatic aortic (valve) stenosis: Secondary | ICD-10-CM

## 2017-10-10 DIAGNOSIS — F40232 Fear of other medical care: Secondary | ICD-10-CM

## 2017-10-10 DIAGNOSIS — K083 Retained dental root: Secondary | ICD-10-CM | POA: Diagnosis not present

## 2017-10-10 DIAGNOSIS — K045 Chronic apical periodontitis: Secondary | ICD-10-CM

## 2017-10-10 DIAGNOSIS — Z01818 Encounter for other preprocedural examination: Secondary | ICD-10-CM

## 2017-10-10 NOTE — Patient Instructions (Signed)
Andre Erickson    Department of Dental Medicine     DR. Zain Bingman      HEART VALVES AND MOUTH CARE:  FACTS:   If you have any infection in your mouth, it can infect your heart valve.  If you heart valve is infected, you will be seriously ill.  Infections in the mouth can be SILENT and do not always cause pain.  Examples of infections in the mouth are gum disease, dental cavities, and abscesses.  Some possible signs of infection are: Bad breath, bleeding gums, or teeth that are sensitive to sweets, hot, and/or cold. There are many other signs as well.  WHAT YOU HAVE TO DO:   Brush your teeth after meals and at bedtime. Spend at least 2 minutes brushing well, especially behind your back teeth and all around your teeth that stand alone. Brush at the gumline also.  Do not go to bed without brushing your teeth and flossing.  If you gums bleed when you brush or floss, do NOT stop brushing or flossing. It usually means that your gums need more attention and better cleaning.   If your Dentist or Dr. Aamilah Augenstein gave you a prescription mouthwash to use, make sure to use it as directed. If you run out of the medication, get a refill at the pharmacy.   If you were given any other medications or directions by your Dentist, please follow them. If you did not understand the directions or forget what you were told, please call. We will be happy to refresh her memory.  If you need antibiotics before dental procedures, make sure you take them one hour prior to every dental visit as directed.   Get a dental checkup every 4-6 months in order to keep your mouth healthy, or to find and treat any new infection. You will most likely need your teeth cleaned or gums treated at the same time.  If you are not able to come in for your scheduled appointment, call your Dentist as soon as possible to reschedule.  If you have a problem in between dental visits, call your Dentist.  

## 2017-10-10 NOTE — Progress Notes (Signed)
DENTAL CONSULTATION  Date of Consultation:  10/10/2017 Patient Name:   Andre Erickson Date of Birth:   [birth year] Medical Record Number: [id number]  VITALS: BP 126/83 (BP Location: Right Arm)   Pulse 90   Temp 98.4 F (36.9 C)   CHIEF COMPLAINT: Patient referred by Dr. Dorris Fetch for a dental consultation.   HPI: Andre Erickson is a 62 year old male recently diagnosed with severe aortic stenosis. Patient with anticipated TAVR procedure. Patient is now seen as part of a medically necessary pre-heart valve surgery dental protocol examination to rule out dental infection that may affect the patient's systemic and anticipated heart valve surgery.  The patient currently denies acute toothaches, swellings, or abscesses. Patient knows that many teeth are"loose" . The patient has not seen a dentist since 2000. Patient had several teeth pulled at that time in [place]  The patient denies complications from those dental extractions.  Patient has been in the [place] for 15 years but has not seen a Education officer, community in the [place]. Patient denies having partial dentures. 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  PROBLEM LIST: Patient Active Problem List   Diagnosis Date Noted  . Aortic stenosis, severe     Priority: High  . 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 Type 2 diabetes mellitus without complications (HCC) 09/10/2013  . Cardiac murmur, unspecified 09/03/2013  . Radiculopathy of lumbar region 03/05/2013  . Disorder of skin or subcutaneous tissue 10/06/2012  . Osteoarthritis 05/10/2012  . Vitamin D deficiency 09/14/2011  . Essential (primary) hypertension 09/13/2011  . Hyperlipidemia 09/13/2011  . Obesity 09/13/2011  . Tinea pedis 09/13/2011    PMH: Past Medical History:  Diagnosis Date  . Hyperlipidemia   . Hypertension   . Morbid obesity (HCC)   . Murmur   . Sleep apnea     PSH: Past Surgical History:   Procedure Laterality Date  . APPENDECTOMY    . CHOLECYSTECTOMY      ALLERGIES: No Known Allergies  MEDICATIONS: Current Outpatient Medications  Medication Sig Dispense Refill  . albuterol (PROVENTIL HFA;VENTOLIN HFA) 108 (90 Base) MCG/ACT inhaler Inhale 2 puffs into the lungs every 4 (four) hours as needed for wheezing or shortness of breath.    Marland Kitchen aspirin 81 MG tablet Take 81 mg by mouth daily.     . Cholecalciferol (VITAMIN D-3) 5000 units TABS Take 5,000 Units by mouth daily.    . diclofenac (VOLTAREN) 75 MG EC tablet Take 75 mg by mouth daily.     . DULoxetine (CYMBALTA) 60 MG capsule Take 60 mg by mouth daily.    . furosemide (LASIX) 40 MG tablet Take 1 tablet (40 mg total) by mouth 2 (two) times daily. 60 tablet 3  . gabapentin (NEURONTIN) 300 MG capsule Take 300 mg by mouth 2 (two) times daily as needed (pain).     Marland Kitchen losartan (COZAAR) 100 MG tablet Take 100 mg by mouth daily.    . potassium chloride SA (K-DUR,KLOR-CON) 20 MEQ tablet Take 1 tablet (20 mEq total) by mouth daily. 30 tablet 3  . pravastatin (PRAVACHOL) 40 MG tablet Take 40 mg by mouth daily.     No current facility-administered medications for this visit.     LABS: Lab Results  Component Value Date   WBC 6.7 09/23/2017   HGB 15.5 09/23/2017   HCT 45.3 09/23/2017   MCV 89.1 09/23/2017   PLT 226 09/23/2017      Component Value Date/Time  NA 138 10/03/2017 0902   K 4.3 10/03/2017 0902   CL 100 10/03/2017 0902   CO2 33 (H) 10/03/2017 0902   GLUCOSE 117 (H) 10/03/2017 0902   BUN 18 10/03/2017 0902   CREATININE 0.88 10/03/2017 0902   CALCIUM 8.8 (L) 10/03/2017 0902   GFRNONAA >60 10/03/2017 0902   GFRAA >60 10/03/2017 0902   No results found for: INR, PROTIME No results found for: PTT  SOCIAL HISTORY: Social History   Socioeconomic History  . Marital status: Married    Spouse name: Lanora Manis   . Number of children: 4  . Years of education: Not on file  . Highest education level: Not on file   Occupational History  . Occupation: disabled  Social Needs  . Financial resource strain: Not on file  . Food insecurity:    Worry: Not on file    Inability: Not on file  . Transportation needs:    Medical: Not on file    Non-medical: Not on file  Tobacco Use  . Smoking status: Former Smoker    Last attempt to quit: 09/27/1978    Years since quitting: 39.0  . Smokeless tobacco: Never Used  Substance and Sexual Activity  . Alcohol use: Not Currently  . Drug use: Never  . Sexual activity: Not on file  Lifestyle  . Physical activity:    Days per week: 0 days    Minutes per session: Not on file  . Stress: Rather much  Relationships  . Social connections:    Talks on phone: Once a week    Gets together: Once a week    Attends religious service: Never    Active member of club or organization: No    Attends meetings of clubs or organizations: Never    Relationship status: Not on file  . Intimate partner violence:    Fear of current or ex partner: No    Emotionally abused: No    Physically abused: No    Forced sexual activity: No  Other Topics Concern  . Not on file  Social History Narrative  . Not on file    FAMILY HISTORY: History reviewed. No pertinent family history.  REVIEW OF SYSTEMS: Reviewed with the patient as per History of present illness. Psych: Patient does have a history of dental phobia.  DENTAL HISTORY: CHIEF COMPLAINT: Patient referred by Dr. Dorris Fetch for a dental consultation.   HPI: Andre Erickson is a 62 year old male recently diagnosed with severe aortic stenosis. Patient with anticipated TAVR procedure. Patient is now seen as part of a medically necessary pre-heart valve surgery dental protocol examination to rule out dental infection that may affect the patient's systemic and anticipated heart valve surgery.  The patient currently denies acute toothaches, swellings, or abscesses. Patient knows that many teeth are"loose" . The patient has  not seen a dentist since 2000. Patient had several teeth pulled at that time in [place]  The patient denies complications from those dental extractions.  Patient has been in the [place] for 15 years but has not seen a Education officer, community in the [place]. Patient denies having partial dentures. 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   DENTAL EXAMINATION: GENERAL:  The patient is a well-developed, well-nourished male in no acute distress. HEAD AND NECK:  There is no palpable neck lymphadenopathy. The patient denies acute TMJ symptoms. INTRAORAL EXAM:  Patient has normal saliva. I do not see any evidence of oral abscess formation. The patient has bilateral mandibular  lingual tori. DENTITION:  The patient has multiple missing teeth numbers 1, 3, 4, 14, 16, 17, 18, 19, 24, 30, and 31. There are retained root segments in the area tooth numbers 9, 12, 13, 21, and 28. PERIODONTAL:  The patient has chronic, advanced periodontal disease with plaque and calculus accumulations, generalized gingival recession, and generalized tooth mobility. There is moderate to severe bone loss noted. DENTAL CARIES/SUBOPTIMAL RESTORATIONS:  Multiple dental caries are noted as per dental charting form. ENDODONTIC:  Patient currently denies acute pulpitis symptoms. The patient does have multiple areas of periapical pathology and radiolucency. CROWN AND BRIDGE:  There are no crown or bridge restorations. PROSTHODONTIC:  Patient denies having partial dentures. OCCLUSION: The patient has a poor occlusal scheme and malocclusion secondary to multiple missing teeth, supra-eruption and drifting of the unopposed teeth into the edentulous areas, and lack of replacement of missing teeth with dental prostheses.  RADIOGRAPHIC INTERPRETATION: An orthopantogram was taken and supplemented with a full series of Periapical radiographs. There are multiple missing teeth. There multiple retained root segments. There multiple areas of  periapical pathology and radiolucency. There is moderate to severe bone loss noted.Multiple dental caries are noted. Multiple diastemas are noted. There is supra-eruption and drifting of the unopposed teeth into the edentulous areas.  ASSESSMENTS: 1. Severe aortic stenosis 2. Pre-heart valve surgery dental protocol 3. Chronic apical periodontitis 4. Multiple retained root segments 5. Multiple dental caries 6. Chronic periodontitis with bone loss 7. Generalized gingival recession 8. Accretions 9. Tooth mobility 10. Multiple missing teeth 11. Supra-eruption and drifting of the unopposed teeth into the edentulous areas 12. Multiple diastemas 13. Poor occlusal scheme and malocclusion 14. 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 Risk for complications up to and including death with anticipated invasive dental procedures in the operating room with general anesthesia secondary to his cardiovascular and respiratory compromise.  PLAN/RECOMMENDATIONS: 1. I discussed the risks, benefits, and complications of various treatment options with the patient in relationship to his medical and dental conditions, anticipated heart valve surgery, and risk for endocarditis. We discussed various treatment options to include no treatment, total and subtotal extractions with alveoloplasty, pre-prosthetic surgery as indicated, periodontal therapy, dental restorations, root canal therapy, crown and bridge therapy, implant therapy, and replacement of missing teeth as indicated. The patient currently wishes to proceed with multiple dental extractions with alveoloplasty, pre-prosthetic surgery as needed, and gross debridement of remaining dentition in the operating room with general anesthesia.  This operating room procedure [facility]  The patient will then proceed with TAVR procedure after adequate healing with cardiothoracic surgery. The patient was  also need to follow-up with a dentist of his choice for continued periodontal therapy and evaluation for upper and lower partial dentures after adequate healing and once medically stable from the anticipated TAVR procedure.  The patient will require antibiotic premedication prior to invasive dental procedures after the anticipated aortic valve replacement per American Heart Association guidelines.  2. Discussion of findings with medical team and coordination of future medical and dental care as needed.  I spent in excess of  120 minutes during the conduct of this consultation and >50% of this time involved direct face-to-face encounter for counseling and/or coordination of the patient's care.    Charlynne Panderonald F. Eeshan Verbrugge, DDS

## 2017-10-13 ENCOUNTER — Ambulatory Visit: Payer: Medicaid Other | Admitting: Cardiovascular Disease

## 2017-10-13 ENCOUNTER — Other Ambulatory Visit: Payer: Self-pay

## 2017-10-13 ENCOUNTER — Encounter: Payer: Self-pay | Admitting: Cardiovascular Disease

## 2017-10-13 VITALS — BP 110/80 | HR 82 | Ht 70.0 in | Wt 359.5 lb

## 2017-10-13 DIAGNOSIS — I350 Nonrheumatic aortic (valve) stenosis: Secondary | ICD-10-CM

## 2017-10-13 DIAGNOSIS — I5022 Chronic systolic (congestive) heart failure: Secondary | ICD-10-CM

## 2017-10-13 DIAGNOSIS — E785 Hyperlipidemia, unspecified: Secondary | ICD-10-CM | POA: Diagnosis not present

## 2017-10-13 DIAGNOSIS — I10 Essential (primary) hypertension: Secondary | ICD-10-CM | POA: Diagnosis not present

## 2017-10-13 MED ORDER — METOPROLOL TARTRATE 50 MG PO TABS: ORAL_TABLET | ORAL | 0 refills | Status: DC

## 2017-10-13 MED ORDER — POTASSIUM CHLORIDE CRYS ER 20 MEQ PO TBCR: 20.0000 meq | EXTENDED_RELEASE_TABLET | Freq: Every day | ORAL | 1 refills | Status: AC

## 2017-10-13 MED ORDER — FUROSEMIDE 40 MG PO TABS: 40.0000 mg | ORAL_TABLET | Freq: Two times a day (BID) | ORAL | 1 refills | Status: DC

## 2017-10-13 NOTE — Patient Instructions (Signed)
Medication Instructions: Your physician recommends that you continue on your current medications as directed. Please refer to the Current Medication list given to you today.  If you need a refill on your cardiac medications before your next appointment, please call your pharmacy.   Follow-Up: Your physician wants you to follow-up in 3 months with Dr. Arida.   Thank you for choosing [facility]!    

## 2017-10-13 NOTE — Progress Notes (Signed)
Cardiology Office Note   Date:  10/13/2017   ID:  Andre Erickson, DOB May 21, 1955, MRN [id number]  PCP:  Dala Dock, PA  Cardiologist:   Lorine Bears, MD   Chief Complaint  Patient presents with  . other    Follow up from cardiac cath. Meds reviewed by the pt. verbally. Pt. c/o shortness of breath & chest pressure. Pt. will have his teeth removed on Thursday, Oct. 3, 2019 at [facility].       History of Present Illness: Andre Erickson is a 62 y.o. male who is here today for a follow-up visit regarding severe aortic stenosis due to bicuspid aortic valve with chronic systolic heart failure.   The patient is originally [place] and speaks reasonable English but his wife is present and helps with translation.  He has known history of morbid obesity, essential hypertension, hyperlipidemia and sleep apnea with poor intolerance to CPAP.  There is no family history of premature coronary artery disease although his father does have A. fib and had coronary artery disease later in life. He was seen recently for progressive exertional dyspnea and leg edema.  He was noted to have a loud systolic murmur in the aortic area. An echocardiogram was done which showed an EF of 30 to 35% with diffuse hypokinesis, grade 2 diastolic dysfunction, severely calcified bicuspid aortic valve with critical stenosis.  Mean gradient was 54 mmHg with a valve area of 0.62. I proceeded with a right and left cardiac catheterization which showed minimal coronary artery disease.  Right heart catheterization showed severely elevated filling pressures with moderate pulmonary hypertension and normal cardiac output.  I increased the dose of furosemide to 40 mg twice daily.  The patient was seen by Dr. Dorris Fetch and he is currently being evaluated for TAVR.  He is going to have dental extractions done before valve surgery.  Past Medical History:  Diagnosis Date  . Aortic valve stenosis   .  Hyperlipidemia   . Hypertension   . Morbid obesity (HCC)   . Murmur   . Sleep apnea     Past Surgical History:  Procedure Laterality Date  . APPENDECTOMY    . CARDIAC CATHETERIZATION  09/26/2017  . CHOLECYSTECTOMY       Current Outpatient Medications  Medication Sig Dispense Refill  . albuterol (PROVENTIL HFA;VENTOLIN HFA) 108 (90 Base) MCG/ACT inhaler Inhale 2 puffs into the lungs every 4 (four) hours as needed for wheezing or shortness of breath.    Marland Kitchen aspirin 81 MG tablet Take 81 mg by mouth daily.     . Cholecalciferol (VITAMIN D-3) 5000 units TABS Take 5,000 Units by mouth daily.    . diclofenac (VOLTAREN) 75 MG EC tablet Take 75 mg by mouth daily.     . DULoxetine (CYMBALTA) 60 MG capsule Take 60 mg by mouth daily.    . furosemide (LASIX) 40 MG tablet Take 1 tablet (40 mg total) by mouth 2 (two) times daily. 60 tablet 3  . gabapentin (NEURONTIN) 300 MG capsule Take 300 mg by mouth 2 (two) times daily as needed (pain).     Marland Kitchen losartan (COZAAR) 100 MG tablet Take 100 mg by mouth daily.    . potassium chloride SA (K-DUR,KLOR-CON) 20 MEQ tablet Take 1 tablet (20 mEq total) by mouth daily. 30 tablet 3  . pravastatin (PRAVACHOL) 40 MG tablet Take 40 mg by mouth daily.     No current facility-administered medications for this visit.  Allergies:   Patient has no known allergies.    Social History:  The patient  reports that he quit smoking about 39 years ago. He has never used smokeless tobacco. He reports that he drank alcohol. He reports that he does not use drugs.   Family History:  The patient's family history is remarkable for atrial fibrillation and coronary artery disease as outlined above   ROS:  Please see the history of present illness.   Otherwise, review of systems are positive for none.   All other systems are reviewed and negative.    PHYSICAL EXAM: VS:  BP 110/80 (BP Location: Left Arm, Patient Position: Sitting, Cuff Size: Large)   Pulse 82   Ht 5\' 10"   (1.778 m)   Wt (!) 359 lb 8 oz (163.1 kg)   BMI 51.58 kg/m  , BMI Body mass index is 51.58 kg/m. GEN: Well nourished, well developed, in no acute distress  HEENT: normal  Neck: Jugular venous pressure is not well visualized, carotid bruits, or masses Cardiac: RRR; no  rubs, or gallops.  3 out of 6 crescendo decrescendo systolic murmur in the aortic area which is late peaking with very diminished S2.  There is moderate bilateral leg edema with chronic stasis dermatitis Respiratory:  clear to auscultation bilaterally, normal work of breathing GI: soft, nontender, nondistended, + BS MS: no deformity or atrophy  Skin: warm and dry, no rash Neuro:  Strength and sensation are intact Psych: euthymic mood, full affect Right radial pulses normal with no hematoma.  EKG:  EKG is ordered today. The ekg ordered today demonstrates normal sinus rhythm with nonspecific IVCD and left axis deviation.  Recent Labs: 09/23/2017: Hemoglobin 15.5; Platelets 226 10/03/2017: BUN 18; Creatinine, Ser 0.88; Potassium 4.3; Sodium 138    Lipid Panel No results found for: CHOL, TRIG, HDL, CHOLHDL, VLDL, LDLCALC, LDLDIRECT    Wt Readings from Last 3 Encounters:  10/13/17 (!) 359 lb 8 oz (163.1 kg)  09/29/17 (!) 356 lb (161.5 kg)  09/26/17 (!) 364 lb (165.1 kg)       No flowsheet data found.    ASSESSMENT AND PLAN:  1.  Chronic systolic heart failure: This is likely due to severe aortic stenosis.  Volume overload has improved after increasing the dose of furosemide to 40 mg twice daily.  Continue treatment with losartan.  Beta-blocker can be considered after his valve surgery.  At the current time, I do not want to drop his systolic blood pressure in the setting of critical aortic stenosis.  2.  Critical aortic stenosis due to bicuspid aortic valve: The patient is currently being evaluated for TAVR.  3.  Essential hypertension: Blood pressure is well controlled on current medications.  4.   Hyperlipidemia: Currently on pravastatin.  5.  Morbid obesity: Patient will be referred to cardiac rehab after TAVR.    Disposition:   FU in 3 months  Signed,  Lorine Bears, MD  10/13/2017 11:08 AM    [facility]

## 2017-10-17 NOTE — Pre-Procedure Instructions (Addendum)
Eldred Sooy Fernos-Capo  10/17/2017    Your procedure is scheduled on Thursday, October 20, 2017 at 7:30 AM.   Report to [facility] Entrance "A" Admitting Office at 5:30 AM.   Call this number if you have problems the morning of surgery: [phone]   Questions prior to day of surgery, please call [phone] between 8 & 4 PM.   Remember:  Do not eat or drink after midnight Wednesday, 10/19/17.             Take these medicines the morning of surgery with A SIP OF WATER: Duloxetine (Cymbalta), Gabapentin (Neurontin) - if needed, Albuterol inhaler - if needed (bring inhaler with you morning of surgery).  Stop Aspirin ONLY if instructed by surgeon or cardiologist. Do not use NSAIDS (Ibuprofen, Aleve, etc) prior to surgery.     Do not wear jewelry.  Do not wear lotions, powders, cologne or deodorant.  Men may shave face and neck.  Do not bring valuables to the hospital.  [facility] is not responsible for any belongings or valuables.  Contacts, dentures or bridgework may not be worn into surgery.  Leave your suitcase in the car.  After surgery it may be brought to your room.  For patients admitted to the hospital, discharge time will be determined by your treatment team.  Patients discharged the day of surgery will not be allowed to drive home.    [facility] - Preparing for Surgery  Before surgery, you can play an important role.  Because skin is not sterile, your skin needs to be as free of germs as possible.  You can reduce the number of germs on you skin by washing with CHG (chlorahexidine gluconate) soap before surgery.  CHG is an antiseptic cleaner which kills germs and bonds with the skin to continue killing germs even after washing.  Oral Hygiene is also important in reducing the risk of infection.  Remember to brush your teeth with your regular toothpaste the morning of surgery.  Please DO NOT use if you have an allergy to CHG or antibacterial soaps.  If your skin  becomes reddened/irritated stop using the CHG and inform your nurse when you arrive at Short Stay.  Do not shave (including legs and underarms) for at least 48 hours prior to the first CHG shower.  You may shave your face.  Please follow these instructions carefully:   1.  Shower with CHG Soap the night before surgery and the morning of Surgery.  2.  If you choose to wash your hair, wash your hair first as usual with your normal shampoo.  3.  After you shampoo, rinse your hair and body thoroughly to remove the shampoo. 4.  Use CHG as you would any other liquid soap.  You can apply chg directly to the skin and wash gently with a      scrungie or washcloth.           5.  Apply the CHG Soap to your body ONLY FROM THE NECK DOWN.   Do not use on open wounds or open sores. Avoid contact with your eyes, ears, mouth and genitals (private parts).  Wash genitals (private parts) with your normal soap.  6.  Wash thoroughly, paying special attention to the area where your surgery will be performed.  7.  Thoroughly rinse your body with warm water from the neck down.  8.  DO NOT shower/wash with your normal soap after using and rinsing off the CHG  Soap.  9.  Pat yourself dry with a clean towel.            10.  Wear clean pajamas.            11.  Place clean sheets on your bed the night of your first shower and do not sleep with pets.  Day of Surgery  Shower as above. Do not apply any lotions/deodorants the morning of surgery.   Please wear clean clothes to the hospital. Remember to brush your teeth with toothpaste.    Please read over the fact sheets that you were given.

## 2017-10-17 NOTE — Pre-Procedure Instructions (Signed)
Cejay Cambre Fernos-Capo  10/17/2017      [facility] - [place], [place] - [address] [place] [address] Phone: [phone] Fax: [phone]    Your procedure is scheduled on October 3rd.  Report to [facility] Admitting at 0530 A.M.  Call this number if you have problems the morning of surgery:  [phone]   Remember:  Do not eat or drink after midnight.     Take these medicines the morning of surgery with A SIP OF WATER   Albuterol (if needed), Cymbalta, Gabapentin, Metoprolol, Pravastatin    Do not wear jewelry  Do not wear lotions, powders, or colognes, or deodorant.  Do not shave 48 hours prior to surgery.  Men may shave face and neck.  Do not bring valuables to the hospital.  [facility] is not responsible for any belongings or valuables.  Contacts, dentures or bridgework may not be worn into surgery.  Leave your suitcase in the car.  After surgery it may be brought to your room.  For patients admitted to the hospital, discharge time will be determined by your treatment team.  Patients discharged the day of surgery will not be allowed to drive home.    [place]- Preparing For Surgery  Before surgery, you can play an important role. Because skin is not sterile, your skin needs to be as free of germs as possible. You can reduce the number of germs on your skin by washing with CHG (chlorahexidine gluconate) Soap before surgery.  CHG is an antiseptic cleaner which kills germs and bonds with the skin to continue killing germs even after washing.    Oral Hygiene is also important to reduce your risk of infection.  Remember - BRUSH YOUR TEETH THE MORNING OF SURGERY WITH YOUR REGULAR TOOTHPASTE  Please do not use if you have an allergy to CHG or antibacterial soaps. If your skin becomes reddened/irritated stop using the CHG.  Do not shave (including legs and underarms) for at least 48 hours prior to first CHG shower. It is OK  to shave your face.  Please follow these instructions carefully.   1. Shower the NIGHT BEFORE SURGERY and the MORNING OF SURGERY with CHG.   2. If you chose to wash your hair, wash your hair first as usual with your normal shampoo.  3. After you shampoo, rinse your hair and body thoroughly to remove the shampoo.  4. Use CHG as you would any other liquid soap. You can apply CHG directly to the skin and wash gently with a scrungie or a clean washcloth.   5. Apply the CHG Soap to your body ONLY FROM THE NECK DOWN.  Do not use on open wounds or open sores. Avoid contact with your eyes, ears, mouth and genitals (private parts). Wash Face and genitals (private parts)  with your normal soap.  6. Wash thoroughly, paying special attention to the area where your surgery will be performed.  7. Thoroughly rinse your body with warm water from the neck down.  8. DO NOT shower/wash with your normal soap after using and rinsing off the CHG Soap.  9. Pat yourself dry with a CLEAN TOWEL.  10. Wear CLEAN PAJAMAS to bed the night before surgery, wear comfortable clothes the morning of surgery  11. Place CLEAN SHEETS on your bed the night of your first shower and DO NOT SLEEP WITH PETS.    Day of Surgery:  Do not apply  any deodorants/lotions.  Please wear clean clothes to the hospital/surgery center.   Remember to brush your teeth WITH YOUR REGULAR TOOTHPASTE.    Please read over the following fact sheets that you were given.

## 2017-10-18 ENCOUNTER — Encounter (HOSPITAL_COMMUNITY)
Admission: RE | Admit: 2017-10-18 | Discharge: 2017-10-18 | Disposition: A | Discharge status: Home / Self Care | Payer: Medicaid Other | Source: Ambulatory Visit | Attending: Dentistry | Admitting: Dentistry

## 2017-10-18 ENCOUNTER — Encounter (HOSPITAL_COMMUNITY): Payer: Self-pay

## 2017-10-18 ENCOUNTER — Other Ambulatory Visit: Payer: Self-pay

## 2017-10-18 DIAGNOSIS — Z01812 Encounter for preprocedural laboratory examination: Secondary | ICD-10-CM | POA: Diagnosis not present

## 2017-10-18 HISTORY — DX: Unspecified osteoarthritis, unspecified site: M19.90

## 2017-10-18 HISTORY — DX: Gastro-esophageal reflux disease without esophagitis: K21.9

## 2017-10-18 HISTORY — DX: Unspecified asthma, uncomplicated: J45.909

## 2017-10-18 LAB — BASIC METABOLIC PANEL
Anion gap: 7 (ref 5–15)
BUN: 15 mg/dL (ref 6–20)
CHLORIDE: 101 mmol/L (ref 98–111)
CO2: 30 mmol/L (ref 22–32)
CREATININE: 1.04 mg/dL (ref 0.61–1.24)
Calcium: 8.8 mg/dL — ABNORMAL LOW (ref 8.9–10.3)
GFR calc Af Amer: >60 mL/min (ref >60)
GFR calc non Af Amer: >60 mL/min (ref >60)
Glucose, Bld: 98 mg/dL (ref 70–99)
Potassium: 3.8 mmol/L (ref 3.5–5.1)
SODIUM: 138 mmol/L (ref 135–145)

## 2017-10-18 LAB — CBC
HCT: 49.6 % (ref 39.0–52.0)
Hemoglobin: 16.1 g/dL (ref 13.0–17.0)
MCH: 29.8 pg (ref 26.0–34.0)
MCHC: 32.5 g/dL (ref 30.0–36.0)
MCV: 91.9 fL (ref 78.0–100.0)
PLATELETS: 235 10*3/uL (ref 150–400)
RBC: 5.4 MIL/uL (ref 4.22–5.81)
RDW: 13.2 % (ref 11.5–15.5)
WBC: 7.1 10*3/uL (ref 4.0–10.5)

## 2017-10-18 NOTE — Progress Notes (Addendum)
Pt has known severe aortic valve stenosis. He denies any recent chest pain. Does have shortness of breath with exertion. Pt states he's not diabetic. Denies any recent sinus infection, broken nose or nasal surgery. He states he has narrow nasal passages. Fayrene Fearing, [place] seeing pt as an anesthesia consult. Pt and wife state they have not been instructed whether to stop Aspirin or not. I called and spoke with Dr. Kristin Bruins and he states pt does not need to stop the Aspirin. Pt and wife given those instructions.

## 2017-10-18 NOTE — Progress Notes (Signed)
Anesthesia Chart Review:  Case:  [id number] Date/Time:  10/20/17 0715   Procedure:  MULTIPLE EXTRACTION WITH ALVEOLOPLASTY, PRE-PROSTHETIC SURGERY AND GROSS DEBRIDEMENT OF REMAINING TEETH (N/A )   Anesthesia type:  General   Pre-op diagnosis:  Severe arotic stenosis with Chronic Periodontitis   Location:  [facility] OR ROOM 10 / [facility] OR   Surgeon:  Charlynne Pander, DDS      DISCUSSION: 62 yo male former smoker. 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  Pt with severe AS in need of pre TAVR tooth extractions. Echocardiogram 09/2017 showed EF 30-35%, diffuse hypokinesis, critical aortic stenosis with a valve area of 0.6 cm with a mean gradient of 58 and peak gradient of 93 mmHg.  He underwent cardiac catheterization which showed no significant coronary artery disease, moderate pulm htn.  I saw pt at PAT appt to discuss nasotracheal intubation. He denies hx of broken nose, deviated septum, or nasal surgery. Nares are patent and he is able to breathe through each side. He does say that he tends to breathe through his mouth because he feels he does not get enough air through his nose. No contraindication to nasotracheal intubation noted.  Per Dr. Robin Searing he is to remain on ASA.  Anticipate he can proceed with surgery as planned barring acute status change.  VS: BP 126/73   Pulse 77   Temp 36.8 C   Resp 20   Ht 5\' 10"  (1.778 m)   Wt (!) 161.3 kg   SpO2 92%   BMI 51.02 kg/m   PROVIDERS: Dala Dock, PA is PCP  Lorine Bears, MD is Cardiologist  Charlett Lango, MD is Cardiothoracic surgeon  LABS: Labs reviewed: Acceptable for surgery. (all labs ordered are listed, but only abnormal results are displayed)  Labs Reviewed  BASIC METABOLIC PANEL - Abnormal; Notable for the following components:      Result Value   Calcium 8.8 (*)    All other components  within normal limits  CBC     IMAGES: N/A   EKG: 10/13/2017: Normal sinus rhythm.  Left axis deviation.  Nonspecific intraventricular conduction delay.  Nonspecific ST and T wave abnormality.  CV: Cath 09/26/2017: 1.  Minimal luminal irregularities with no evidence of obstructive coronary artery disease.  Difficulty filling the left coronary system likely due to severe aortic stenosis and suboptimal visualization due to morbid obesity. 2.  Right heart catheterization showed moderately to severely elevated filling pressures, moderate pulmonary hypertension and normal cardiac output.  RA pressure: 14 / 18 mmHg, RV pressure 56 / 10 mmHg, PA pressure 58/29 mmHg, pulmonary capillary wedge pressure is 31 mmHg, cardiac output is 7.44 with a cardiac index of 2.76.  Recommendations: I did not attempt to cross the aortic valve.  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 Recommend aortic valve replacement. The patient is also significantly volume overloaded and I increase his furosemide to 40 mg once daily.  We will check basic metabolic profile in 1 week.  TTE 09/21/2017: Inform patient that echo showed reduced EF at 30 to 35% with severe aortic valve stenosis.  The patient will need surgery to replace his aortic valve but before that we have to do a right and left cardiac catheterization. I recommend a right and left cardiac catheterization to be done on Monday.  Past Medical History:  Diagnosis Date  . Aortic  valve stenosis   . Arthritis   . Asthma    as a child  . GERD (gastroesophageal reflux disease)   . Hyperlipidemia   . Hypertension   . Morbid obesity (HCC)   . Murmur   . Sleep apnea    trying to use the CPAP    Past Surgical History:  Procedure Laterality Date  . APPENDECTOMY    . CARDIAC CATHETERIZATION  09/26/2017  . CHOLECYSTECTOMY      MEDICATIONS: . albuterol (PROVENTIL HFA;VENTOLIN HFA) 108 (90 Base) MCG/ACT inhaler  .  aspirin 81 MG tablet  . Cholecalciferol (VITAMIN D-3) 5000 units TABS  . diclofenac (VOLTAREN) 75 MG EC tablet  . DULoxetine (CYMBALTA) 60 MG capsule  . furosemide (LASIX) 40 MG tablet  . gabapentin (NEURONTIN) 300 MG capsule  . losartan (COZAAR) 100 MG tablet  . metoprolol tartrate (LOPRESSOR) 50 MG tablet  . potassium chloride SA (K-DUR,KLOR-CON) 20 MEQ tablet  . pravastatin (PRAVACHOL) 40 MG tablet   No current facility-administered medications for this encounter.    Zannie Cove [facility] Short Stay Center/Anesthesiology Phone [phone] 10/18/2017 4:17 PM

## 2017-10-19 ENCOUNTER — Encounter (HOSPITAL_COMMUNITY): Payer: Self-pay | Admitting: Anesthesiology

## 2017-10-19 MED ORDER — DEXTROSE 5 % IV SOLN
3.0000 g | INTRAVENOUS | Status: AC
  Administered 2017-10-20: 3 g via INTRAVENOUS
  Filled 2017-10-19: qty 3

## 2017-10-19 NOTE — Anesthesia Preprocedure Evaluation (Addendum)
Anesthesia Evaluation  Patient identified by MRN, date of birth, ID band Patient awake    Reviewed: Allergy & Precautions, NPO status , Patient's Chart, lab work & pertinent test results  Airway Mallampati: III  TM Distance: >3 FB Neck ROM: Full    Dental  (+) Poor Dentition, Missing, Loose   Pulmonary asthma , sleep apnea and Continuous Positive Airway Pressure Ventilation , former smoker,    breath sounds clear to auscultation       Cardiovascular hypertension, Pt. on home beta blockers  Rhythm:Regular Rate:Normal + Systolic murmurs    Neuro/Psych Depression    GI/Hepatic GERD  ,  Endo/Other  diabetes  Renal/GU      Musculoskeletal  (+) Arthritis ,   Abdominal (+) + obese,   Peds  Hematology   Anesthesia Other Findings - HLD  Reproductive/Obstetrics                            Echo: - Left ventricle: The cavity size was mildly dilated. There was   moderate concentric hypertrophy. Systolic function was moderately   to severely reduced. The estimated ejection fraction was in the   range of 30% to 35%. Diffuse hypokinesis. Regional wall motion   abnormalities cannot be excluded. Features are consistent with a   pseudonormal left ventricular filling pattern, with concomitant   abnormal relaxation and increased filling pressure (grade 2   diastolic dysfunction). - Aortic valve: Severely calcified leaflets. Unable to exclude   bicuspid aortic valve. Transvalvular velocity was increased.   There was critical stenosis. There was mild regurgitation. Peak   velocity (S): 481 cm/s. Mean gradient (S): 54 mm Hg. Peak   gradient (S): 93 mm Hg. Valve area (VTI): 0.62 cm^2. - Left atrium: The atrium was moderately dilated. - Right ventricle: Systolic function was normal. - Pulmonary arteries: Systolic pressure could not be accurately   estimated.  EKG: NSR  Anesthesia Physical Anesthesia  Plan  ASA: IV  Anesthesia Plan: General   Post-op Pain Management:    Induction: Intravenous  PONV Risk Score and Plan: 3 and Ondansetron, Dexamethasone and Midazolam  Airway Management Planned: Nasal ETT  Additional Equipment: Arterial line  Intra-op Plan:   Post-operative Plan: Extubation in OR  Informed Consent: I have reviewed the patients History and Physical, chart, labs and discussed the procedure including the risks, benefits and alternatives for the proposed anesthesia with the patient or authorized representative who has indicated his/her understanding and acceptance.   Dental advisory given  Plan Discussed with: CRNA  Anesthesia Plan Comments:        Anesthesia Quick Evaluation

## 2017-10-20 ENCOUNTER — Encounter (HOSPITAL_COMMUNITY): Payer: Self-pay | Admitting: *Deleted

## 2017-10-20 ENCOUNTER — Other Ambulatory Visit: Payer: Self-pay

## 2017-10-20 ENCOUNTER — Encounter (HOSPITAL_COMMUNITY)
Admission: RE | Disposition: A | Discharge status: Home / Self Care | Payer: Self-pay | Source: Ambulatory Visit | Attending: Cardiovascular Disease

## 2017-10-20 ENCOUNTER — Observation Stay (HOSPITAL_COMMUNITY)
Admission: RE | Admit: 2017-10-20 | Discharge: 2017-10-21 | Disposition: A | Discharge status: Home / Self Care | Payer: Medicaid Other | Source: Ambulatory Visit | Attending: Cardiovascular Disease | Admitting: Cardiovascular Disease

## 2017-10-20 ENCOUNTER — Ambulatory Visit (HOSPITAL_COMMUNITY): Payer: Medicaid Other | Admitting: Anesthesiology

## 2017-10-20 ENCOUNTER — Ambulatory Visit (HOSPITAL_COMMUNITY): Payer: Medicaid Other | Admitting: Physician Assistant

## 2017-10-20 DIAGNOSIS — E785 Hyperlipidemia, unspecified: Secondary | ICD-10-CM | POA: Insufficient documentation

## 2017-10-20 DIAGNOSIS — K045 Chronic apical periodontitis: Secondary | ICD-10-CM | POA: Diagnosis present

## 2017-10-20 DIAGNOSIS — I350 Nonrheumatic aortic (valve) stenosis: Secondary | ICD-10-CM

## 2017-10-20 DIAGNOSIS — Z79899 Other long term (current) drug therapy: Secondary | ICD-10-CM | POA: Diagnosis not present

## 2017-10-20 DIAGNOSIS — Z8249 Family history of ischemic heart disease and other diseases of the circulatory system: Secondary | ICD-10-CM | POA: Diagnosis not present

## 2017-10-20 DIAGNOSIS — Z87891 Personal history of nicotine dependence: Secondary | ICD-10-CM | POA: Insufficient documentation

## 2017-10-20 DIAGNOSIS — Z9119 Patient's noncompliance with other medical treatment and regimen: Secondary | ICD-10-CM | POA: Diagnosis not present

## 2017-10-20 DIAGNOSIS — K029 Dental caries, unspecified: Secondary | ICD-10-CM | POA: Diagnosis not present

## 2017-10-20 DIAGNOSIS — E119 Type 2 diabetes mellitus without complications: Secondary | ICD-10-CM | POA: Diagnosis not present

## 2017-10-20 DIAGNOSIS — F329 Major depressive disorder, single episode, unspecified: Secondary | ICD-10-CM | POA: Diagnosis not present

## 2017-10-20 DIAGNOSIS — Z98818 Other dental procedure status: Secondary | ICD-10-CM

## 2017-10-20 DIAGNOSIS — I5023 Acute on chronic systolic (congestive) heart failure: Secondary | ICD-10-CM | POA: Diagnosis not present

## 2017-10-20 DIAGNOSIS — Q231 Congenital insufficiency of aortic valve: Secondary | ICD-10-CM | POA: Insufficient documentation

## 2017-10-20 DIAGNOSIS — K0889 Other specified disorders of teeth and supporting structures: Secondary | ICD-10-CM | POA: Diagnosis not present

## 2017-10-20 DIAGNOSIS — Z7982 Long term (current) use of aspirin: Secondary | ICD-10-CM | POA: Insufficient documentation

## 2017-10-20 DIAGNOSIS — I5020 Unspecified systolic (congestive) heart failure: Secondary | ICD-10-CM | POA: Diagnosis present

## 2017-10-20 DIAGNOSIS — K036 Deposits [accretions] on teeth: Secondary | ICD-10-CM | POA: Insufficient documentation

## 2017-10-20 DIAGNOSIS — K083 Retained dental root: Secondary | ICD-10-CM | POA: Diagnosis not present

## 2017-10-20 DIAGNOSIS — Z6841 Body Mass Index (BMI) 40.0 and over, adult: Secondary | ICD-10-CM | POA: Diagnosis not present

## 2017-10-20 DIAGNOSIS — I10 Essential (primary) hypertension: Secondary | ICD-10-CM | POA: Diagnosis not present

## 2017-10-20 DIAGNOSIS — G4733 Obstructive sleep apnea (adult) (pediatric): Secondary | ICD-10-CM | POA: Insufficient documentation

## 2017-10-20 DIAGNOSIS — M270 Developmental disorders of jaws: Secondary | ICD-10-CM | POA: Insufficient documentation

## 2017-10-20 DIAGNOSIS — Z791 Long term (current) use of non-steroidal anti-inflammatories (NSAID): Secondary | ICD-10-CM | POA: Diagnosis not present

## 2017-10-20 DIAGNOSIS — J45909 Unspecified asthma, uncomplicated: Secondary | ICD-10-CM | POA: Diagnosis not present

## 2017-10-20 HISTORY — PX: MULTIPLE EXTRACTIONS WITH ALVEOLOPLASTY: SHX5342

## 2017-10-20 HISTORY — PX: OTHER SURGICAL HISTORY: SHX169

## 2017-10-20 SURGERY — MULTIPLE EXTRACTION WITH ALVEOLOPLASTY
Anesthesia: General | Site: Mouth

## 2017-10-20 MED ORDER — PHENYLEPHRINE 40 MCG/ML (10ML) SYRINGE FOR IV PUSH (FOR BLOOD PRESSURE SUPPORT)
PREFILLED_SYRINGE | INTRAVENOUS | Status: DC | PRN
  Administered 2017-10-20 (×2): 80 ug via INTRAVENOUS

## 2017-10-20 MED ORDER — LACTATED RINGERS IV SOLN: INTRAVENOUS | Status: DC

## 2017-10-20 MED ORDER — MEPERIDINE HCL 50 MG/ML IJ SOLN: 6.2500 mg | INTRAMUSCULAR | Status: DC | PRN

## 2017-10-20 MED ORDER — 0.9 % SODIUM CHLORIDE (POUR BTL) OPTIME
TOPICAL | Status: DC | PRN
  Administered 2017-10-20: 1000 mL

## 2017-10-20 MED ORDER — PRAVASTATIN SODIUM 40 MG PO TABS: 40.0000 mg | ORAL_TABLET | Freq: Every day | ORAL | Status: DC

## 2017-10-20 MED ORDER — DULOXETINE HCL 60 MG PO CPEP: 60.0000 mg | ORAL_CAPSULE | Freq: Every day | ORAL | Status: DC

## 2017-10-20 MED ORDER — GABAPENTIN 300 MG PO CAPS: 300.0000 mg | ORAL_CAPSULE | Freq: Two times a day (BID) | ORAL | Status: DC | PRN

## 2017-10-20 MED ORDER — LIDOCAINE 2% (20 MG/ML) 5 ML SYRINGE
INTRAMUSCULAR | Status: AC
  Filled 2017-10-20: qty 5

## 2017-10-20 MED ORDER — FENTANYL CITRATE (PF) 100 MCG/2ML IJ SOLN
INTRAMUSCULAR | Status: DC | PRN
  Administered 2017-10-20: 50 ug via INTRAVENOUS
  Administered 2017-10-20: 100 ug via INTRAVENOUS
  Administered 2017-10-20 (×2): 50 ug via INTRAVENOUS

## 2017-10-20 MED ORDER — FENTANYL CITRATE (PF) 100 MCG/2ML IJ SOLN
25.0000 ug | INTRAMUSCULAR | Status: DC | PRN
  Administered 2017-10-20 (×4): 25 ug via INTRAVENOUS

## 2017-10-20 MED ORDER — EPHEDRINE 5 MG/ML INJ
INTRAVENOUS | Status: AC
  Filled 2017-10-20: qty 10

## 2017-10-20 MED ORDER — DEXAMETHASONE SODIUM PHOSPHATE 10 MG/ML IJ SOLN
INTRAMUSCULAR | Status: DC | PRN
  Administered 2017-10-20: 10 mg via INTRAVENOUS

## 2017-10-20 MED ORDER — GLYCOPYRROLATE PF 0.2 MG/ML IJ SOSY
PREFILLED_SYRINGE | INTRAMUSCULAR | Status: DC | PRN
  Administered 2017-10-20: .1 mg via INTRAVENOUS

## 2017-10-20 MED ORDER — POTASSIUM CHLORIDE CRYS ER 20 MEQ PO TBCR
20.0000 meq | EXTENDED_RELEASE_TABLET | Freq: Every day | ORAL | Status: DC
  Administered 2017-10-21: 20 meq via ORAL
  Filled 2017-10-20: qty 1

## 2017-10-20 MED ORDER — PHENYLEPHRINE 40 MCG/ML (10ML) SYRINGE FOR IV PUSH (FOR BLOOD PRESSURE SUPPORT)
PREFILLED_SYRINGE | INTRAVENOUS | Status: AC
  Filled 2017-10-20: qty 10

## 2017-10-20 MED ORDER — DEXAMETHASONE SODIUM PHOSPHATE 10 MG/ML IJ SOLN
INTRAMUSCULAR | Status: AC
  Filled 2017-10-20: qty 1

## 2017-10-20 MED ORDER — PROPOFOL 10 MG/ML IV BOLUS
INTRAVENOUS | Status: AC
  Filled 2017-10-20: qty 20

## 2017-10-20 MED ORDER — OXYMETAZOLINE HCL 0.05 % NA SOLN
NASAL | Status: AC
  Filled 2017-10-20: qty 15

## 2017-10-20 MED ORDER — FENTANYL CITRATE (PF) 100 MCG/2ML IJ SOLN
INTRAMUSCULAR | Status: AC
  Filled 2017-10-20: qty 2

## 2017-10-20 MED ORDER — PROPOFOL 10 MG/ML IV BOLUS
INTRAVENOUS | Status: DC | PRN
  Administered 2017-10-20: 100 mg via INTRAVENOUS

## 2017-10-20 MED ORDER — BUPIVACAINE-EPINEPHRINE (PF) 0.5% -1:200000 IJ SOLN
INTRAMUSCULAR | Status: AC
  Filled 2017-10-20: qty 3.6

## 2017-10-20 MED ORDER — OXYMETAZOLINE HCL 0.05 % NA SOLN
NASAL | Status: DC | PRN
  Administered 2017-10-20: 3 via NASAL

## 2017-10-20 MED ORDER — MORPHINE SULFATE (PF) 2 MG/ML IV SOLN
2.0000 mg | INTRAVENOUS | Status: DC | PRN
  Administered 2017-10-20: 2 mg via INTRAVENOUS
  Filled 2017-10-20: qty 1

## 2017-10-20 MED ORDER — ALBUTEROL SULFATE (2.5 MG/3ML) 0.083% IN NEBU: 2.5000 mg | INHALATION_SOLUTION | Freq: Four times a day (QID) | RESPIRATORY_TRACT | Status: DC | PRN

## 2017-10-20 MED ORDER — AMINOCAPROIC ACID SOLUTION 5% (50 MG/ML): 10.0000 mL | ORAL | Status: DC

## 2017-10-20 MED ORDER — OXYCODONE-ACETAMINOPHEN 5-325 MG PO TABS
1.0000 | ORAL_TABLET | Freq: Four times a day (QID) | ORAL | Status: DC | PRN
  Administered 2017-10-20: 2 via ORAL
  Administered 2017-10-21: 1 via ORAL
  Administered 2017-10-21: 2 via ORAL
  Filled 2017-10-20 (×2): qty 2
  Filled 2017-10-20: qty 1

## 2017-10-20 MED ORDER — ONDANSETRON HCL 4 MG/2ML IJ SOLN
INTRAMUSCULAR | Status: AC
  Filled 2017-10-20: qty 2

## 2017-10-20 MED ORDER — LACTATED RINGERS IV SOLN
INTRAVENOUS | Status: DC | PRN
  Administered 2017-10-20 (×2): via INTRAVENOUS

## 2017-10-20 MED ORDER — SUGAMMADEX SODIUM 500 MG/5ML IV SOLN
INTRAVENOUS | Status: AC
  Filled 2017-10-20: qty 5

## 2017-10-20 MED ORDER — FENTANYL CITRATE (PF) 250 MCG/5ML IJ SOLN
INTRAMUSCULAR | Status: AC
  Filled 2017-10-20: qty 5

## 2017-10-20 MED ORDER — MIDAZOLAM HCL 2 MG/2ML IJ SOLN
INTRAMUSCULAR | Status: AC
  Filled 2017-10-20: qty 2

## 2017-10-20 MED ORDER — SUGAMMADEX SODIUM 200 MG/2ML IV SOLN
INTRAVENOUS | Status: DC | PRN
  Administered 2017-10-20: 300 mg via INTRAVENOUS

## 2017-10-20 MED ORDER — LOSARTAN POTASSIUM 50 MG PO TABS: 100.0000 mg | ORAL_TABLET | Freq: Every day | ORAL | Status: DC

## 2017-10-20 MED ORDER — OXYCODONE-ACETAMINOPHEN 5-325 MG PO TABS: ORAL_TABLET | ORAL | 0 refills | Status: DC

## 2017-10-20 MED ORDER — VITAMIN D 1000 UNITS PO TABS
5000.0000 [IU] | ORAL_TABLET | Freq: Every day | ORAL | Status: DC
  Administered 2017-10-21: 5000 [IU] via ORAL
  Filled 2017-10-20: qty 5

## 2017-10-20 MED ORDER — LIDOCAINE-EPINEPHRINE 2 %-1:100000 IJ SOLN
INTRAMUSCULAR | Status: AC
  Filled 2017-10-20: qty 10.2

## 2017-10-20 MED ORDER — AMINOCAPROIC ACID SOLUTION 5% (50 MG/ML)
10.0000 mL | ORAL | Status: AC
  Administered 2017-10-20 (×8): 10 mL via ORAL

## 2017-10-20 MED ORDER — LIDOCAINE-EPINEPHRINE 2 %-1:100000 IJ SOLN
INTRAMUSCULAR | Status: DC | PRN
  Administered 2017-10-20: 8.5 mL via INTRADERMAL

## 2017-10-20 MED ORDER — HEMOSTATIC AGENTS (NO CHARGE) OPTIME
TOPICAL | Status: DC | PRN
  Administered 2017-10-20: 1 via TOPICAL

## 2017-10-20 MED ORDER — SUCCINYLCHOLINE CHLORIDE 200 MG/10ML IV SOSY
PREFILLED_SYRINGE | INTRAVENOUS | Status: DC | PRN
  Administered 2017-10-20: 140 mg via INTRAVENOUS

## 2017-10-20 MED ORDER — ROCURONIUM BROMIDE 50 MG/5ML IV SOSY
PREFILLED_SYRINGE | INTRAVENOUS | Status: AC
  Filled 2017-10-20: qty 5

## 2017-10-20 MED ORDER — SODIUM CHLORIDE 0.9 % IV SOLN
INTRAVENOUS | Status: DC | PRN
  Administered 2017-10-20: 60 ug/min via INTRAVENOUS
  Administered 2017-10-20: 10:00:00 via INTRAVENOUS

## 2017-10-20 MED ORDER — ROCURONIUM BROMIDE 50 MG/5ML IV SOSY
PREFILLED_SYRINGE | INTRAVENOUS | Status: DC | PRN
  Administered 2017-10-20: 40 mg via INTRAVENOUS

## 2017-10-20 MED ORDER — PROMETHAZINE HCL 25 MG/ML IJ SOLN: 6.2500 mg | INTRAMUSCULAR | Status: DC | PRN

## 2017-10-20 MED ORDER — BUPIVACAINE-EPINEPHRINE 0.5% -1:200000 IJ SOLN
INTRAMUSCULAR | Status: DC | PRN
  Administered 2017-10-20: 3.6 mL

## 2017-10-20 MED ORDER — ONDANSETRON HCL 4 MG/2ML IJ SOLN
INTRAMUSCULAR | Status: DC | PRN
  Administered 2017-10-20: 4 mg via INTRAVENOUS

## 2017-10-20 MED ORDER — ALBUTEROL SULFATE HFA 108 (90 BASE) MCG/ACT IN AERS: 2.0000 | INHALATION_SPRAY | RESPIRATORY_TRACT | Status: DC | PRN

## 2017-10-20 MED ORDER — EPHEDRINE SULFATE-NACL 50-0.9 MG/10ML-% IV SOSY
PREFILLED_SYRINGE | INTRAVENOUS | Status: DC | PRN
  Administered 2017-10-20: 10 mg via INTRAVENOUS
  Administered 2017-10-20 (×2): 5 mg via INTRAVENOUS
  Administered 2017-10-20 (×3): 10 mg via INTRAVENOUS

## 2017-10-20 MED ORDER — GLYCOPYRROLATE PF 0.2 MG/ML IJ SOSY
PREFILLED_SYRINGE | INTRAMUSCULAR | Status: AC
  Filled 2017-10-20: qty 1

## 2017-10-20 MED ORDER — AMINOCAPROIC ACID SOLUTION 5% (50 MG/ML)
10.0000 mL | ORAL | Status: DC
  Filled 2017-10-20: qty 100

## 2017-10-20 MED ORDER — FUROSEMIDE 40 MG PO TABS
40.0000 mg | ORAL_TABLET | Freq: Two times a day (BID) | ORAL | Status: DC
  Administered 2017-10-20: 40 mg via ORAL
  Filled 2017-10-20: qty 1

## 2017-10-20 SURGICAL SUPPLY — 43 items
ALCOHOL 70% 16 OZ (MISCELLANEOUS) ×3 IMPLANT
ATTRACTOMAT 16X20 MAGNETIC DRP (DRAPES) ×3 IMPLANT
BANDAGE HEMOSTAT MRDH 4X4 STRL (MISCELLANEOUS) IMPLANT
BLADE SURG 15 STRL LF DISP TIS (BLADE) ×2 IMPLANT
BLADE SURG 15 STRL SS (BLADE) ×6
BNDG HEMOSTAT MRDH 4X4 STRL (MISCELLANEOUS)
COVER SURGICAL LIGHT HANDLE (MISCELLANEOUS) ×3 IMPLANT
COVER WAND RF STERILE (DRAPES) ×3 IMPLANT
GAUZE 4X4 16PLY RFD (DISPOSABLE) ×3 IMPLANT
GAUZE PACKING FOLDED 2  STR (GAUZE/BANDAGES/DRESSINGS) ×2
GAUZE PACKING FOLDED 2 STR (GAUZE/BANDAGES/DRESSINGS) ×1 IMPLANT
GAUZE SPONGE 4X4 16PLY XRAY LF (GAUZE/BANDAGES/DRESSINGS) ×2 IMPLANT
GLOVE BIO SURGEON STRL SZ 6.5 (GLOVE) ×2 IMPLANT
GLOVE BIO SURGEONS STRL SZ 6.5 (GLOVE) ×1
GLOVE SURG ORTHO 8.0 STRL STRW (GLOVE) ×3 IMPLANT
GOWN STRL REUS W/ TWL LRG LVL3 (GOWN DISPOSABLE) ×1 IMPLANT
GOWN STRL REUS W/TWL 2XL LVL3 (GOWN DISPOSABLE) ×3 IMPLANT
GOWN STRL REUS W/TWL LRG LVL3 (GOWN DISPOSABLE) ×3
HEMOSTAT SURGICEL 2X14 (HEMOSTASIS) IMPLANT
KIT BASIN OR (CUSTOM PROCEDURE TRAY) ×3 IMPLANT
KIT TURNOVER KIT B (KITS) ×3 IMPLANT
MANIFOLD NEPTUNE WASTE (CANNULA) ×3 IMPLANT
NDL BLUNT 16X1.5 OR ONLY (NEEDLE) IMPLANT
NDL DENTAL 27 LONG (NEEDLE) IMPLANT
NEEDLE BLUNT 16X1.5 OR ONLY (NEEDLE) ×3 IMPLANT
NEEDLE DENTAL 27 LONG (NEEDLE) ×6 IMPLANT
NS IRRIG 1000ML POUR BTL (IV SOLUTION) ×3 IMPLANT
PACK EENT II TURBAN DRAPE (CUSTOM PROCEDURE TRAY) ×3 IMPLANT
PAD ARMBOARD 7.5X6 YLW CONV (MISCELLANEOUS) ×3 IMPLANT
SPONGE SURGIFOAM ABS GEL 100 (HEMOSTASIS) ×2 IMPLANT
SPONGE SURGIFOAM ABS GEL 12-7 (HEMOSTASIS) IMPLANT
SPONGE SURGIFOAM ABS GEL SZ50 (HEMOSTASIS) IMPLANT
SUCTION FRAZIER HANDLE 10FR (MISCELLANEOUS) ×2
SUCTION TUBE FRAZIER 10FR DISP (MISCELLANEOUS) ×1 IMPLANT
SUT CHROMIC 3 0 PS 2 (SUTURE) ×10 IMPLANT
SUT CHROMIC 4 0 P 3 18 (SUTURE) IMPLANT
SYR 50ML SLIP (SYRINGE) ×3 IMPLANT
TOWEL NATURAL 10PK STERILE (DISPOSABLE) ×3 IMPLANT
TUBE CONNECTING 12'X1/4 (SUCTIONS) ×1
TUBE CONNECTING 12X1/4 (SUCTIONS) ×2 IMPLANT
WATER STERILE IRR 1000ML POUR (IV SOLUTION) ×3 IMPLANT
WATER TABLETS ICX (MISCELLANEOUS) ×3 IMPLANT
YANKAUER SUCT BULB TIP NO VENT (SUCTIONS) ×3 IMPLANT

## 2017-10-20 NOTE — Progress Notes (Signed)
PRE-OPERATIVE NOTE:  10/20/2017 [name] [id number]  VITALS: BP 129/80   Pulse 78   Temp 98.3 F (36.8 C)   Resp 20   Ht 5\' 10"  (1.778 m)   Wt (!) 161.3 kg   SpO2 93%   BMI 51.02 kg/m   Lab Results  Component Value Date   WBC 7.1 10/18/2017   HGB 16.1 10/18/2017   HCT 49.6 10/18/2017   MCV 91.9 10/18/2017   PLT 235 10/18/2017   BMET    Component Value Date/Time   NA 138 10/18/2017 0847   K 3.8 10/18/2017 0847   CL 101 10/18/2017 0847   CO2 30 10/18/2017 0847   GLUCOSE 98 10/18/2017 0847   BUN 15 10/18/2017 0847   CREATININE 1.04 10/18/2017 0847   CALCIUM 8.8 (L) 10/18/2017 0847   GFRNONAA >60 10/18/2017 0847   GFRAA >60 10/18/2017 0847    No results found for: INR, PROTIME No results found for: PTT   Thurston Pounds presents for multiple dental extractions with alveoloplasty, pre-prosthetic surgery as needed, and gross debridement of remaining dentition in the operating room with general anesthesia.    SUBJECTIVE: The patient denies any acute medical or dental changes and agrees to proceed with treatment as planned.  EXAM: No sign of acute dental changes.  ASSESSMENT: Patient is affected by chronic apical periodontitis, retained root segments, dental caries, chronic periodontitis, accretions, loose teeth, and bilateral mandibular lingual tori.  PLAN: Patient agrees to proceed with treatment as planned in the operating room as previously discussed and accepts the risks, benefits, and complications of the proposed treatment. Patient is aware of the risk for bleeding, bruising, swelling, infection, pain, nerve damage, soft tissue damage, damage to adjacent teeth, sinus involvement, root tip fracture, mandible fracture, and the risks of complications associated with the anesthesia. Patient also is aware of the potential for other complications up to and including death due to his overall cardiovascular and respiratory compromise.     Charlynne Pander, DDS

## 2017-10-20 NOTE — Transfer of Care (Signed)
Immediate Anesthesia Transfer of Care Note  Patient: Andre Erickson  Procedure(s) Performed: Extraction of tooth #'s 2,7-10, 12,13,15,20,22,23,25, 26, 28, 29, and 32 with alveoloplasty, bilateral mandibular tori reductions, bilateral maxillary lateral exostoses reductions, and gross debridement of remaining teeth (N/A Mouth)  Patient Location: PACU  Anesthesia Type:General  Level of Consciousness: awake, oriented and patient cooperative  Airway & Oxygen Therapy: Patient Spontanous Breathing and Patient connected to face mask oxygen  Post-op Assessment: Report given to RN and Post -op Vital signs reviewed and stable  Post vital signs: Reviewed and stable  Last Vitals:  Vitals Value Taken Time  BP 125/82 10/20/2017 10:33 AM  Temp    Pulse 103 10/20/2017 10:35 AM  Resp 20 10/20/2017 10:35 AM  SpO2 85 % 10/20/2017 10:35 AM  Vitals shown include unvalidated device data.  Last Pain:  Vitals:   10/20/17 0631  PainSc: 0-No pain      Patients Stated Pain Goal: 3 (10/20/17 0631)  Complications: No apparent anesthesia complications

## 2017-10-20 NOTE — H&P (Addendum)
Cardiology Admission History and Physical:   Patient ID: Andre Erickson MRN: [id number]; DOB: 1955/12/20   Admission date: 10/20/2017  Primary Care Provider: Dala Dock, PA Primary Cardiologist: Lorine Bears, MD  Primary Electrophysiologist:  None   Chief Complaint:  Post-dental extraction  Patient Profile:   Andre Erickson is a 62 y.o. male with a PMH of severe aortic stenosis undergoing TAVR evaluation with TCTS, chronic systolic CHF (non-ischemic), HTN, HLD, OSA on CPAP, morbid obesity, who presented for teeth extraction.   History of Present Illness:   Mr. Rollo presented 10/20/17 for planned dental extraction in preparation for upcoming TAVR. Patient underwent extraction of 16 teeth and tolerated the procedure without complications. Cardiology asked to admit the patient overnight for observation.   Patient was seen by Dr. Kirke Corin outpatient 10/13/17. He is undergoing TAVR work-up with Dr. Kirke Corin and TCTS. R/LHC with mild luminal irregularities but no obstructive CAD, severely elevated filling pressures, moderate pulmHTN, and severe aortic stenosis. His lasix was titrated 40mg  BID with improvement in SOB and LE edema. He is planned for CT C/A/P and coronary CTA next week with follow-up with Dr. Laneta Simmers scheduled 10/27/17.   At the time of this evaluation he reports som mild pain in his mouth. He feels tired. Denies chest pain or SOB.    Past Medical History:  Diagnosis Date  . Aortic valve stenosis   . Arthritis   . Asthma    as a child  . GERD (gastroesophageal reflux disease)   . Hyperlipidemia   . Hypertension   . Morbid obesity (HCC)   . Murmur   . Sleep apnea    trying to use the CPAP    Past Surgical History:  Procedure Laterality Date  . APPENDECTOMY    . CARDIAC CATHETERIZATION  09/26/2017  . CHOLECYSTECTOMY       Medications Prior to Admission: Prior to Admission medications   Medication Sig Start Date End Date Taking? Authorizing Provider   albuterol (PROVENTIL HFA;VENTOLIN HFA) 108 (90 Base) MCG/ACT inhaler Inhale 2 puffs into the lungs every 4 (four) hours as needed for wheezing or shortness of breath.   Yes [provider]  aspirin 81 MG tablet Take 81 mg by mouth daily.    Yes [provider]  Cholecalciferol (VITAMIN D-3) 5000 units TABS Take 5,000 Units by mouth daily.   Yes [provider]  diclofenac (VOLTAREN) 75 MG EC tablet Take 75 mg by mouth daily.    Yes [provider]  DULoxetine (CYMBALTA) 60 MG capsule Take 60 mg by mouth daily.   Yes [provider]  furosemide (LASIX) 40 MG tablet Take 1 tablet (40 mg total) by mouth 2 (two) times daily. 10/13/17  Yes Iran Ouch, MD  gabapentin (NEURONTIN) 300 MG capsule Take 300 mg by mouth 2 (two) times daily as needed (pain).    Yes [provider]  potassium chloride SA (K-DUR,KLOR-CON) 20 MEQ tablet Take 1 tablet (20 mEq total) by mouth daily. 10/13/17  Yes Iran Ouch, MD  pravastatin (PRAVACHOL) 40 MG tablet Take 40 mg by mouth daily.   Yes [provider]  losartan (COZAAR) 100 MG tablet Take 100 mg by mouth daily.    [provider]  metoprolol tartrate (LOPRESSOR) 50 MG tablet Take one tablet by mouth one hour prior to TAVR CT scan on 10/24/2017 10/13/17   Tonny Bollman, MD  oxyCODONE-acetaminophen (PERCOCET) 5-325 MG tablet Take one to 2 tablets by mouth every 6 hours as  needed for moderate to severe pain. 10/20/17   Charlynne Pander, DDS     Allergies:   No Known Allergies  Social History:   Social History   Socioeconomic History  . Marital status: Married    Spouse name: Lanora Manis   . Number of children: 4  . Years of education: Not on file  . Highest education level: Not on file  Occupational History  . Occupation: disabled  Social Needs  . Financial resource strain: Not on file  . Food insecurity:    Worry: Not on file    Inability: Not on file  . Transportation needs:     Medical: Not on file    Non-medical: Not on file  Tobacco Use  . Smoking status: Former Smoker    Last attempt to quit: 09/27/1978    Years since quitting: 39.0  . Smokeless tobacco: Never Used  Substance and Sexual Activity  . Alcohol use: Not Currently  . Drug use: Never  . Sexual activity: Not on file  Lifestyle  . Physical activity:    Days per week: 0 days    Minutes per session: Not on file  . Stress: Rather much  Relationships  . Social connections:    Talks on phone: Once a week    Gets together: Once a week    Attends religious service: Never    Active member of club or organization: No    Attends meetings of clubs or organizations: Never    Relationship status: Not on file  . Intimate partner violence:    Fear of current or ex partner: No    Emotionally abused: No    Physically abused: No    Forced sexual activity: No  Other Topics Concern  . Not on file  Social History Narrative  . Not on file    Family History:   The patient's family history includes Atrial fibrillation in his father; CAD in his father; Cancer in his mother; Colon cancer in his father; Glaucoma in his father; Macular degeneration in his father; Melanoma in his father; Rectal cancer in his father; Skin cancer in his father; Transient ischemic attack in his father.    ROS:  Please see the history of present illness.  All other ROS reviewed and negative.     Physical Exam/Data:   Vitals:   10/20/17 1133 10/20/17 1203 10/20/17 1234 10/20/17 1316  BP: 122/74 118/70 114/78 111/74  Pulse: 88 86 86 86  Resp:   20 18  Temp: (!) 97.3 F (36.3 C)   98 F (36.7 C)  TempSrc:    Oral  SpO2: 98% 95% 95% 97%  Weight:    (!) 162.7 kg  Height:        Intake/Output Summary (Last 24 hours) at 10/20/2017 1330 Last data filed at 10/20/2017 1130 Gross per 24 hour  Intake 1200 ml  Output 150 ml  Net 1050 ml   Filed Weights   10/20/17 0631 10/20/17 1316  Weight: (!) 161.3 kg (!) 162.7 kg   Body  mass index is 51.47 kg/m.  General:  Morbidly obese gentleman laying in bed in no acute distress HEENT: sclera anicteric; ice packs placed to bilateral jaw line; packing in mouth Neck: unable to assess JVD with ice packs in place  Vascular: No carotid bruits; distal pulses 2+ bilaterally   Cardiac:  normal S1, S2; RRR; +murmur, no rubs or gallops Lungs:  clear to auscultation bilaterally, no wheezing, rhonchi or rales  Abd: soft,  obese, nontender, no hepatomegaly  Ext: no pitting edema Musculoskeletal:  No deformities, BUE and BLE strength normal and equal Skin: warm and dry  Neuro:  CNs 2-12 intact, no focal abnormalities noted Psych:  Normal affect    Relevant CV Studies: Echocardiogram 09/21/17: Study Conclusions  - Left ventricle: The cavity size was mildly dilated. There was   moderate concentric hypertrophy. Systolic function was moderately   to severely reduced. The estimated ejection fraction was in the   range of 30% to 35%. Diffuse hypokinesis. Regional wall motion   abnormalities cannot be excluded. Features are consistent with a   pseudonormal left ventricular filling pattern, with concomitant   abnormal relaxation and increased filling pressure (grade 2   diastolic dysfunction). - Aortic valve: Severely calcified leaflets. Unable to exclude   bicuspid aortic valve. Transvalvular velocity was increased.   There was critical stenosis. There was mild regurgitation. Peak   velocity (S): 481 cm/s. Mean gradient (S): 54 mm Hg. Peak   gradient (S): 93 mm Hg. Valve area (VTI): 0.62 cm^2. - Left atrium: The atrium was moderately dilated. - Right ventricle: Systolic function was normal. - Pulmonary arteries: Systolic pressure could not be accurately   estimated.  Right/Left heart catheterization 09/26/17: 1.  Minimal luminal irregularities with no evidence of obstructive coronary artery disease.  Difficulty filling the left coronary system likely due to severe aortic stenosis  and suboptimal visualization due to morbid obesity. 2.  Right heart catheterization showed moderately to severely elevated filling pressures, moderate pulmonary hypertension and normal cardiac output.  RA pressure: 14 / 18 mmHg, RV pressure 56 / 10 mmHg, PA pressure 58/29 mmHg, pulmonary capillary wedge pressure is 31 mmHg, cardiac output is 7.44 with a cardiac index of 2.76.  Recommendations: I did not attempt to cross the aortic valve.  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 Recommend aortic valve replacement. The patient is also significantly volume overloaded and I increase his furosemide to 40 mg once daily.  We will check basic metabolic profile in 1 week.  Laboratory Data:  Chemistry Recent Labs  Lab 10/18/17 0847  NA 138  K 3.8  CL 101  CO2 30  GLUCOSE 98  BUN 15  CREATININE 1.04  CALCIUM 8.8*  GFRNONAA >60  GFRAA >60  ANIONGAP 7    No results for input(s): PROT, ALBUMIN, AST, ALT, ALKPHOS, BILITOT in the last 168 hours. Hematology Recent Labs  Lab 10/18/17 0847  WBC 7.1  RBC 5.40  HGB 16.1  HCT 49.6  MCV 91.9  MCH 29.8  MCHC 32.5  RDW 13.2  PLT 235   Cardiac EnzymesNo results for input(s): TROPONINI in the last 168 hours. No results for input(s): TROPIPOC in the last 168 hours.  BNPNo results for input(s): BNP, PROBNP in the last 168 hours.  DDimer No results for input(s): DDIMER in the last 168 hours.  Radiology/Studies:  No results found.  Assessment and Plan:   1. Dental extraction: s/p 16 dental extractions today with Dr. Kristin Bruins.  - Recommended for amicar oral rinses q1hr x10 hrs. - Will need suture removal in 7-10 days  - Avoid straws - CLD for now and ADAT - Morphine or oxycodone for pain control; Ice packs for supportive care  2. Severe aortic stenosis: undergoing TAVR work-up with Dr. Kirke Corin and TCTS. S/p R/LHC with severe AS, minimal non-obstructive CAD.  - CT C/A/P and Coronary CTA  scheduled for 10/24/17. - F/u with Dr. Laneta Simmers 10/27/17  3. Acute systolic CHF:  Echo 09/21/17 with EF 30-35%. Non-ischemic on cardiac catheterization. Started on lasix 40mg  BID with improvement in SOB and LE edema. Not on BBlocker to minimize hypotension in the setting of critical aortic stenosis.  - Continue lasix 40mg  BID - Continue losartan   4. HTN: BP stable - Continue losartan and lasix  5. HLD: - Continue pravastatin  6. Morbid obesity:  - Planning to refer to cardiac rehab following TAVR.   Severity of Illness: The appropriate patient status for this patient is OBSERVATION. Observation status is judged to be reasonable and necessary in order to provide the required intensity of service to ensure the patient's safety. The patient's presenting symptoms, physical exam findings, and initial radiographic and laboratory data in the context of their medical condition is felt to place them at decreased risk for further clinical deterioration. Furthermore, it is anticipated that the patient will be medically stable for discharge from the hospital within 2 midnights of admission. The following factors support the patient status of observation.   " The patient's presenting symptoms include poor dentition. " The physical exam findings include s/p extraction of a significant number of teeth. " The initial radiographic and laboratory data are n/a.     For questions or updates, please contact [facility] Please consult [email or web address] for contact info under        Signed, Beatriz Stallion, PA-C  10/20/2017 1:30 PM   Patient examined chart reviewed Discussed care with patient and wife. Morbidly obese Hispanic male originally from [place]  Being evaluated for TAVR Post dental extraction today admitted for observation given 16 teeth removed.  Exam with morbid obesity decreased BS base distant HS but AS murmur plus 2 LE edema. Has ice over jaw currently Has CTA;s scheduled for Monday but  has difficulty putting his arms over his head. Will f/u with Dr Excell Seltzer and Kirke Corin once CTA;s done to see if he is a candidate for TAVR  Cath with no CAD and mean gradient by TTE was 54 mmHg with peak gradient across AV of 93 mmHg  Charlton Haws

## 2017-10-20 NOTE — Anesthesia Procedure Notes (Signed)
Procedure Name: Intubation Date/Time: 10/20/2017 7:59 AM Performed by: Julian Reil, CRNA Pre-anesthesia Checklist: Patient identified, Emergency Drugs available, Suction available and Patient being monitored Patient Re-evaluated:Patient Re-evaluated prior to induction Oxygen Delivery Method: Circle system utilized Preoxygenation: Pre-oxygenation with 100% oxygen Induction Type: IV induction Ventilation: Mask ventilation without difficulty Laryngoscope Size: Glidescope and 4 Grade View: Grade I Nasal Tubes: Right, Magill forceps- large, utilized, Nasal prep performed and Nasal Rae Tube size: 7.5 mm Number of attempts: 1 Airway Equipment and Method: Video-laryngoscopy Placement Confirmation: ETT inserted through vocal cords under direct vision,  positive ETCO2 and breath sounds checked- equal and bilateral Secured at: 26 cm Tube secured with: Tape Dental Injury: Teeth and Oropharynx as per pre-operative assessment  Comments: As noted above, Afrin spray bilat nare, lubricated nasal airways 6 mm, 7 mm, 8 mm to right nare and nasal ETT passed per Dr Hart Rochester.  Magills used with Glidescope.

## 2017-10-20 NOTE — H&P (Signed)
10/20/2017  Patient:            Andre Erickson Date of Birth:  Sep 27, 1955 MRN:                [id number]   BP 129/80   Pulse 78   Temp 98.3 F (36.8 C)   Resp 20   Ht 5\' 10"  (1.778 m)   Wt (!) 161.3 kg   SpO2 93%   BMI 51.02 kg/m    Andre Erickson 62 year old male recently diagnosed with severe aortic stenosis with anticipated TAVR procedure. Patient now presents for multiple dental extractions with alveoloplasty, pre-prosthetic surgery as needed and gross debridement remaining dentition in the operating with general anesthesia.  The patient denies any acute medical or dental changes.  Please see note from Dr. Kirke Erickson dated 10/13/2017 to act as the H&P for the dental operating room procedure.  Andre Erickson, DDS   Cardiology Office Note   Date:  10/13/2017   ID:  Andre Erickson 1955/08/04, MRN [id number]  PCP:  Andre Dock, PA          Cardiologist:   Andre Bears, MD       Chief Complaint  Patient presents with  . other    Follow up from cardiac cath. Meds reviewed by the pt. verbally. Pt. c/o shortness of breath & chest pressure. Pt. will have his teeth removed on Thursday, Oct. 3, 2019 at [facility].       History of Present Illness: Andre Erickson is a 62 y.o. male who is here today for a follow-up visit regarding severe aortic stenosis due to bicuspid aortic valve with chronic systolic heart failure.   The patient is originally from [place] and speaks reasonable English but his wife is present and helps with translation.  He has known history of morbid obesity, essential hypertension, hyperlipidemia and sleep apnea with poor intolerance to CPAP.  There is no family history of premature coronary artery disease although his father does have A. fib and had coronary artery disease later in life. He was seen recently for progressive exertional dyspnea and leg edema.  He was noted to have a loud systolic murmur in the aortic  area. An echocardiogram was done which showed an EF of 30 to 35% with diffuse hypokinesis, grade 2 diastolic dysfunction, severely calcified bicuspid aortic valve with critical stenosis.  Mean gradient was 54 mmHg with a valve area of 0.62. I proceeded with a right and left cardiac catheterization which showed minimal coronary artery disease.  Right heart catheterization showed severely elevated filling pressures with moderate pulmonary hypertension and normal cardiac output.  I increased the dose of furosemide to 40 mg twice daily.  The patient was seen by Dr. Dorris Erickson and he is currently being evaluated for TAVR.  He is going to have dental extractions done before valve surgery.      Past Medical History:  Diagnosis Date  . Aortic valve stenosis   . Hyperlipidemia   . Hypertension   . Morbid obesity (HCC)   . Murmur   . Sleep apnea          Past Surgical History:  Procedure Laterality Date  . APPENDECTOMY    . CARDIAC CATHETERIZATION  09/26/2017  . CHOLECYSTECTOMY             Current Outpatient Medications  Medication Sig Dispense Refill  . albuterol (PROVENTIL HFA;VENTOLIN HFA) 108 (90 Base) MCG/ACT inhaler Inhale 2 puffs into the lungs  every 4 (four) hours as needed for wheezing or shortness of breath.    Marland Kitchen aspirin 81 MG tablet Take 81 mg by mouth daily.     . Cholecalciferol (VITAMIN D-3) 5000 units TABS Take 5,000 Units by mouth daily.    . diclofenac (VOLTAREN) 75 MG EC tablet Take 75 mg by mouth daily.     . DULoxetine (CYMBALTA) 60 MG capsule Take 60 mg by mouth daily.    . furosemide (LASIX) 40 MG tablet Take 1 tablet (40 mg total) by mouth 2 (two) times daily. 60 tablet 3  . gabapentin (NEURONTIN) 300 MG capsule Take 300 mg by mouth 2 (two) times daily as needed (pain).     Marland Kitchen losartan (COZAAR) 100 MG tablet Take 100 mg by mouth daily.    . potassium chloride SA (K-DUR,KLOR-CON) 20 MEQ tablet Take 1 tablet (20 mEq total) by mouth daily.  30 tablet 3  . pravastatin (PRAVACHOL) 40 MG tablet Take 40 mg by mouth daily.     No current facility-administered medications for this visit.     Allergies:   Patient has no known allergies.    Social History:  The patient  reports that he quit smoking about 39 years ago. He has never used smokeless tobacco. He reports that he drank alcohol. He reports that he does not use drugs.   Family History:  The patient's family history is remarkable for atrial fibrillation and coronary artery disease as outlined above   ROS:  Please see the history of present illness.   Otherwise, review of systems are positive for none.   All other systems are reviewed and negative.    PHYSICAL EXAM: VS:  BP 110/80 (BP Location: Left Arm, Patient Position: Sitting, Cuff Size: Large)   Pulse 82   Ht 5\' 10"  (1.778 m)   Wt (!) 359 lb 8 oz (163.1 kg)   BMI 51.58 kg/m  , BMI Body mass index is 51.58 kg/m. GEN: Well nourished, well developed, in no acute distress  HEENT: normal  Neck: Jugular venous pressure is not well visualized, carotid bruits, or masses Cardiac: RRR; no  rubs, or gallops.  3 out of 6 crescendo decrescendo systolic murmur in the aortic area which is late peaking with very diminished S2.  There is moderate bilateral leg edema with chronic stasis dermatitis Respiratory:  clear to auscultation bilaterally, normal work of breathing GI: soft, nontender, nondistended, + BS MS: no deformity or atrophy  Skin: warm and dry, no rash Neuro:  Strength and sensation are intact Psych: euthymic mood, full affect Right radial pulses normal with no hematoma.  EKG:  EKG is ordered today. The ekg ordered today demonstrates normal sinus rhythm with nonspecific IVCD and left axis deviation.  Recent Labs: 09/23/2017: Hemoglobin 15.5; Platelets 226 10/03/2017: BUN 18; Creatinine, Ser 0.88; Potassium 4.3; Sodium 138    Lipid Panel Labs(Brief)  No results found for: CHOL, TRIG, HDL,  CHOLHDL, VLDL, LDLCALC, LDLDIRECT         Wt Readings from Last 3 Encounters:  10/13/17 (!) 359 lb 8 oz (163.1 kg)  09/29/17 (!) 356 lb (161.5 kg)  09/26/17 (!) 364 lb (165.1 kg)       No flowsheet data found.    ASSESSMENT AND PLAN:  1.  Chronic systolic heart failure: This is likely due to severe aortic stenosis.  Volume overload has improved after increasing the dose of furosemide to 40 mg twice daily.  Continue treatment with losartan.  Beta-blocker can be  considered after his valve surgery.  At the current time, I do not want to drop his systolic blood pressure in the setting of critical aortic stenosis.  2.  Critical aortic stenosis due to bicuspid aortic valve: The patient is currently being evaluated for TAVR.  3.  Essential hypertension: Blood pressure is well controlled on current medications.  4.  Hyperlipidemia: Currently on pravastatin.  5.  Morbid obesity: Patient will be referred to cardiac rehab after TAVR.    Disposition:   FU in 3 months  Signed,  Andre Bears, MD  10/13/2017 11:08 AM     [facility]        Electronically signed by Iran Ouch, MD at 10/13/2017 11:58 AM

## 2017-10-20 NOTE — Anesthesia Procedure Notes (Signed)
Arterial Line Insertion Start/End10/03/2017 7:04 AM, 10/20/2017 7:08 AM Performed by: Julian Reil, CRNA, CRNA  Patient location: Pre-op. Preanesthetic checklist: patient identified, IV checked, site marked, risks and benefits discussed, surgical consent, monitors and equipment checked and pre-op evaluation Lidocaine 1% used for infiltration and patient sedated Right, radial was placed Catheter size: 20 G Hand hygiene performed  and maximum sterile barriers used   Attempts: 1 Procedure performed without using ultrasound guided technique. Following insertion, dressing applied and Biopatch. Post procedure assessment: normal  Patient tolerated the procedure well with no immediate complications.

## 2017-10-20 NOTE — Discharge Instructions (Signed)

## 2017-10-20 NOTE — Op Note (Signed)
OPERATIVE REPORT  Patient:            Andre Erickson Date of Birth:  [birth year] MRN:                [id number]   DATE OF PROCEDURE:  10/20/2017  PREOPERATIVE DIAGNOSES: 1.  Severe aortic stenosis 2.  Pre-heart valve surgery dental protocol 3.  Chronic apical periodontitis 4.  Dental caries 5.  Multiple retained root segments 6.  Chronic periodontitis 7.  Accretions 8.  Bilateral mandibular lingual tori  POSTOPERATIVE DIAGNOSES: 1.  Severe aortic stenosis 2.  Pre-heart valve surgery dental protocol 3.  Chronic apical periodontitis 4.  Dental caries 5.  Multiple retained root segments 6.  Chronic periodontitis 7.  Accretions 8.  Bilateral mandibular lingual tori 9.  Bilateral maxillary lateral exostoses  OPERATIONS: 1. Multiple extraction of tooth numbers 2, 7 -10, 12, 13, 15, 20, 21, 23, 25, 26, 28, 29, and 32 2.  4 Quadrants of alveoloplasty 3.  Bilateral mandibular lingual tori reductions 4.  Bilateral maxillary lateral exostoses reductions 5.  Gross debridement of remaining dentition   SURGEON: Charlynne Pander, DDS  ASSISTANT: Pearletha Alfred (dental assistant)  ANESTHESIA: General anesthesia via nasoendotracheal tube.  MEDICATIONS: 1. Ancef 3 g IV prior to invasive dental procedures. 2. Local anesthesia with a total utilization of 5 carpules each containing 34 mg of lidocaine with 0.017 mg of epinephrine as well as 2 carpules each containing 9 mg of bupivacaine with 0.009 mg of epinephrine.  SPECIMENS: There are 16 teeth that were discarded.  DRAINS: None  CULTURES: None  COMPLICATIONS: None  ESTIMATED BLOOD LOSS: 150 MLs.  INTRAVENOUS FLUIDS: 1000 MLs of Lactated ringers solution.  INDICATIONS: The patient was recently diagnosed with severe aortic stenosis.  A medically necessary dental consultation was then requested to evaluate poor dentition.  The patient was examined and treatment planned for multiple extractions with alveoloplasty,  pre-prosthetic surgery as needed, and gross debridement of the remaining dentition in the operating room with general anesthesia.  This treatment plan was formulated to decrease the risks and complications associated with dental infection from affecting the patient's systemic health and the anticipated heart valve surgery.  OPERATIVE FINDINGS: Patient was examined operating room number 11.  The teeth were identified for extraction.  Patient was also found to have bilateral mandibular lingual tori and bilateral maxillary lateral exostosis that need to be reduced as part of the pre-prosthetic surgery.  The patient was noted be affected by chronic apical periodontitis, multiple retained root segments, dental caries, chronic periodontitis, loose teeth, bilateral mandibular lingual tori, and bilateral maxillary lateral exostoses.  DESCRIPTION OF PROCEDURE: Patient was brought to the main operating room number 11. Patient was then placed in the supine position on the operating table. General anesthesia was then induced per the anesthesia team. The patient was then prepped and draped in the usual manner for dental medicine procedure. A timeout was performed. The patient was identified and procedures were verified. A throat pack was placed at this time. The oral cavity was then thoroughly examined with the findings noted above. The patient was then ready for dental medicine procedure as follows:  Local anesthesia was then administered sequentially with a total utilization of 5 carpules each containing 34 mg of lidocaine with 0.017 mg of epinephrine as well as 2 carpules  each containing 9 mg bupivacaine with 0.009 mg of epinephrine.  The Maxillary left and right quadrants first approached. Anesthesia was then delivered utilizing infiltration with lidocaine  with epinephrine. A #15 blade incision was then made from the maxillary right tuberosity and extended to the maxillary left tuberosity.  A  surgical flap was  then carefully reflected. The appropriate maxillary teeth were then subluxated with a series of straight elevators. Tooth number 2 was then removed with a 53R forceps without complication.  Tooth numbers 7, 8, 9, 10, 12, 13 were then removed with a 150 forceps without complications.  Tooth #15 was then removed with a 53L forceps without complications.  Alveoloplasty was then performed utilizing a ronguers and bone file to help achieve primary closure.  During the alveoloplasty procedure buccal and lateral exostoses were reduced utilizing a surgical handpiece and bur copious amounts sterile saline.  Further alveoloplasty was then performed utilizing a ronguers and bone file.  The surgical sites were then irrigated with copious amounts of sterile saline x4. The tissues were approximated and trimmed appropriately.  A piece of Surgifoam was placed in each extraction socket as indicated.  The maxillary right surgical site was then closed from the maxillary right tuberosity and extended to the distal #6 utilizing 3-0 chromic at suture in a continuous interrupted suture technique x1.  The maxillary anterior surgical site was then closed from the mesial of #11 extended to the mesial #6 utilizing 3-0 chromic gut suture in a continuous interrupted suture technique x1.  The maxillary left surgical site was then closed from the maxillary left tuberosity and extended to the distal #11 utilizing 3-0 chromic at suture in a continuous interrupted suture technique x1.    At this point time, the mandibular quadrants were approached. The patient was given bilateral inferior alveolar nerve blocks and long buccal nerve blocks utilizing the bupivacaine with epinephrine. Further infiltration was then achieved utilizing the lidocaine with epinephrine. A 15 blade incision was then made from the distal of number #18 and extended to the distal of #32.  A surgical flap was then carefully reflected.  Lower teeth with the exception of tooth  numbers 22 and 27 were then subluxated with a series of straight elevators.  Tooth numbers 20, 21, 23, 25, 26, 28, 29 then removed with a 151 forceps without complications.  Tooth #32 was then removed with a 17 forceps without complications.  Alveoloplasty was then performed utilizing a ronguers and bone file.  At this point time the surgical flaps of the lingual aspects of the lower left and lower right quadrants were further reflected to expose the bilateral mandibular lingual tori.  These tori were then reduced utilizing a surgical handpiece and bur and copious amounts of sterile saline.  Further alveoloplasty was then performed utilizing a ronguers and bone file to help achieve primary closure.  The tissues were approximated and trimmed appropriately.  The surgical sites were then irrigated with copious amounts sterile saline x4.  A piece of Surgifoam was placed in each extraction socket appropriately.  The mandibular left surgical site was then closed from the distal of #18 and extended to the distal #22 utilizing 3-0 chromic gut suture in a continuous rapid suture technique x1.  The mandibular anterior segment was then closed from the mesial of #22 and extended to the mesial of #27 utilizing 3-0 chromic gut suture in a continuous rapid suture technique x1.  The mandibular right surgical site was then closed from the distal of #32 and extended to the distal of #22 utilizing 3-0 chromic gut suture in a continuous rapid suture technique x1.  One additional interrupted suture was then placed further  close surgical site.    At this point in time, a gross debridement procedure was performed on the remaining dentition.  A sonic scaler was used to remove accretions.  A series of hand curettes were then used to further remove accretions.  The Sonic scaler was then again used to further refine the removal of Accretions.  This completed the gross debridement procedure.  At this point time, the entire mouth was  irrigated with copious amounts of sterile saline. The patient was examined for complications, seeing none, the dental medicine procedure was deemed to be complete. The throat pack was removed at this time. An oral airway was then placed at the request of the anesthesia team. A series of 4 x 4 gauze moistened with Amicar 5% rinse were placed in the mouth to aid hemostasis. The patient was then handed over to the anesthesia team for final disposition. After an appropriate amount of time, the patient was extubated and taken to the postanesthsia care unit in good condition. All counts were correct for the dental medicine procedure.  The patient is to continue the Amicar rinses postoperatively.  Patient is to rinse with 10 mils every hour for the next 10 hours in a swish and spit manner.  Patient is to be admitted for overnight observation by the cardiology team.  Patient will then be seen in 7 to 10 days for evaluation for suture removal.   Charlynne Pander, DDS.

## 2017-10-20 NOTE — Anesthesia Postprocedure Evaluation (Signed)
Anesthesia Post Note  Patient: Andre Erickson  Procedure(s) Performed: Extraction of tooth #'s 2,7-10, 12,13,15,20,22,23,25, 26, 28, 29, and 32 with alveoloplasty, bilateral mandibular tori reductions, bilateral maxillary lateral exostoses reductions, and gross debridement of remaining teeth (N/A Mouth)     Patient location during evaluation: PACU Anesthesia Type: General Level of consciousness: awake and alert Pain management: pain level controlled Vital Signs Assessment: post-procedure vital signs reviewed and stable Respiratory status: spontaneous breathing, nonlabored ventilation, respiratory function stable and patient connected to nasal cannula oxygen Cardiovascular status: blood pressure returned to baseline and stable Postop Assessment: no apparent nausea or vomiting Anesthetic complications: no    Last Vitals:  Vitals:   10/20/17 1203 10/20/17 1234  BP: 118/70 114/78  Pulse: 86 86  Resp:  20  Temp:    SpO2: 95% 95%                  Shelton Silvas

## 2017-10-21 ENCOUNTER — Encounter (HOSPITAL_COMMUNITY): Payer: Self-pay | Admitting: Dentistry

## 2017-10-21 DIAGNOSIS — K08199 Complete loss of teeth due to other specified cause, unspecified class: Secondary | ICD-10-CM

## 2017-10-21 DIAGNOSIS — K029 Dental caries, unspecified: Secondary | ICD-10-CM | POA: Diagnosis not present

## 2017-10-21 DIAGNOSIS — I5020 Unspecified systolic (congestive) heart failure: Secondary | ICD-10-CM | POA: Diagnosis present

## 2017-10-21 DIAGNOSIS — M270 Developmental disorders of jaws: Secondary | ICD-10-CM

## 2017-10-21 DIAGNOSIS — Z98818 Other dental procedure status: Secondary | ICD-10-CM | POA: Diagnosis not present

## 2017-10-21 DIAGNOSIS — K045 Chronic apical periodontitis: Secondary | ICD-10-CM | POA: Diagnosis not present

## 2017-10-21 DIAGNOSIS — K0530 Chronic periodontitis, unspecified: Secondary | ICD-10-CM

## 2017-10-21 DIAGNOSIS — K0889 Other specified disorders of teeth and supporting structures: Secondary | ICD-10-CM

## 2017-10-21 DIAGNOSIS — I350 Nonrheumatic aortic (valve) stenosis: Secondary | ICD-10-CM | POA: Diagnosis not present

## 2017-10-21 DIAGNOSIS — K036 Deposits [accretions] on teeth: Secondary | ICD-10-CM

## 2017-10-21 DIAGNOSIS — K083 Retained dental root: Secondary | ICD-10-CM | POA: Diagnosis not present

## 2017-10-21 DIAGNOSIS — M278 Other specified diseases of jaws: Secondary | ICD-10-CM

## 2017-10-21 LAB — CBC
HEMATOCRIT: 44.5 % (ref 39.0–52.0)
HEMOGLOBIN: 14.3 g/dL (ref 13.0–17.0)
MCH: 29.8 pg (ref 26.0–34.0)
MCHC: 32.1 g/dL (ref 30.0–36.0)
MCV: 92.7 fL (ref 78.0–100.0)
Platelets: 202 10*3/uL (ref 150–400)
RBC: 4.8 MIL/uL (ref 4.22–5.81)
RDW: 13.4 % (ref 11.5–15.5)
WBC: 12.1 10*3/uL — AB (ref 4.0–10.5)

## 2017-10-21 LAB — BASIC METABOLIC PANEL
ANION GAP: 10 (ref 5–15)
BUN: 16 mg/dL (ref 6–20)
CALCIUM: 8.4 mg/dL — AB (ref 8.9–10.3)
CHLORIDE: 97 mmol/L — AB (ref 98–111)
CO2: 30 mmol/L (ref 22–32)
CREATININE: 0.94 mg/dL (ref 0.61–1.24)
GFR calc non Af Amer: >60 mL/min (ref >60)
Glucose, Bld: 125 mg/dL — ABNORMAL HIGH (ref 70–99)
Potassium: 4.6 mmol/L (ref 3.5–5.1)
SODIUM: 137 mmol/L (ref 135–145)

## 2017-10-21 LAB — HIV ANTIBODY (ROUTINE TESTING W REFLEX): HIV Screen 4th Generation wRfx: NONREACTIVE

## 2017-10-21 MED ORDER — FUROSEMIDE 40 MG PO TABS: 40.0000 mg | ORAL_TABLET | Freq: Two times a day (BID) | ORAL | 1 refills | Status: AC

## 2017-10-21 NOTE — Progress Notes (Signed)
IV and telemetry discontinued at this time. CCMD notified. Discharge instructions reviewed with patient. All questions answered. Written RX of pain medication given to patient.   Ernestina Columbia, RN

## 2017-10-21 NOTE — Discharge Summary (Signed)
Discharge Summary    Patient ID: Andre Erickson MRN: [id number]; DOB: 08-25-1955  Admit date: 10/20/2017 Discharge date: 10/21/2017  Primary Care Provider: Dala Dock, PA  Primary Cardiologist: Lorine Bears, MD  Primary Electrophysiologist:  None   Discharge Diagnoses    Principal Problem:   Other dental procedure status Active Problems:   Severe aortic stenosis   HFrEF (heart failure with reduced ejection fraction) (HCC)   Essential (primary) hypertension   Hyperlipidemia   Type 2 diabetes mellitus without complications (HCC)   Allergies No Known Allergies  Diagnostic Studies/Procedures    Procedures: 1. Multiple extraction of tooth numbers 2, 7-10, 12, 13, 15, 20, 21, 23, 25, 26, 28, 29, and 32 2. Four quadrants of alveoloplasty 3. Bilateral mandibular lingual tori reductions 4. Bilateral maxillary lateral exostoses reductions 5. Gross debridement of remaining dentition  _____________   History of Present Illness     Andre Erickson presented 10/20/17 for planned dental extraction in preparation for upcoming TAVR. Patient underwent extraction of 16 teeth and tolerated the procedure without complications. Cardiology was asked to admit the patient overnight for observation.   Patient was seen by Dr. Kirke Corin outpatient on 10/13/17. Andre Erickson is undergoing TAVR work-up with Dr. Kirke Corin and TCTS. Right/left heart catheterization on 09/26/2017 showed mild luminal irregularities but no obstructive CAD, severely elevated filling pressures, moderate pulmonary hypertention, and severe aortic stenosis. His lasix was titrated to 40mg  twice daily with improvement in shortness of breath and lower extremity edema. Andre Erickson is planned for CT angiogram of chest, abdomen, and pelvis and coronary CTA next week with follow-up with Dr. Laneta Simmers scheduled on 10/27/17.   At the time initial evaluation by Cardiology, Andre Erickson reported feeling tired with some mild pain in his mouth. Andre Erickson denied any chest pain or  shortness of breath.   Hospital Course     Consultants: Dental Medicine and Nutrition  Dental Extraction Patient presented yesterday (10/20/2017) for planned dental extraction. Andre Erickson tolerated the procedure well without any complications.  - Per Dr. Kristin Bruins (Dental Medicine):  - Salt water rinses as needed every 2 hours while awake to aid healing.  - Brush teeth after meals and at bedtime.  - Advance diet as tolerated to a soft diet.  - Return to Dental medicine in 7 to 10 days for evaluation of healing and suture removal.   - Percocet 5-325mg , take 1-2 tablets every 6 hours as needed for moderate to severe pain.  Severe Aortic Stenosis Cardiology was asked to admit the patient overnight for observation following dental extraction. Patient had no significant overnight events. Patient currently undergoing TAVR work-up with Dr. Kirke Corin and TCTS. - Patient to keep scheduled appointment for CT angiogram of chest/abdomen/pelvis and coronary CT angiogram on 10/24/2017. - Patient to keep scheduled follow up appointment with Dr. Laneta Simmers on 10/24/2017.    Systolic Congestive Heart Failure Patient did not appear volume overloaded on morning of discharge. Lungs were clear and there was no significant pitting edema of lower extremities. Blood pressure was soft but stable at 96/66. Given patient's critical aortic stenosis, Lasix and Losartan were held in order to minimize hypotension. - Discontinue Losartan at discharge. - Restart Lasix 40mg  twice daily tomorrow.  - Will continue to hold beta blocker to minimize hypotension in setting of critical aortic stenosis.  Hypertension Blood pressure stable throughout admission but soft this morning at 96/66. We want to minimize hypotension in the setting of critical aortic stenosis. - Discontinue Losartan at discharge. - Restart Lasix tomorrow as above.  Hyperlipidemia - Continue Pravastatin 40mg  daily.   Morbid Obesity - Plan to refer to cardiac rehab  following TAVR.  _____________   Discharge Vitals Blood pressure 95/61, pulse 86, temperature 98 F (36.7 C), temperature source Oral, resp. rate 10, height 5\' 10"  (1.778 m), weight (!) 162.7 kg, SpO2 92 %.  Filed Weights   10/20/17 0631 10/20/17 1316  Weight: (!) 161.3 kg (!) 162.7 kg    Labs & Radiologic Studies    CBC Recent Labs    10/21/17 0424  WBC 12.1*  HGB 14.3  HCT 44.5  MCV 92.7  PLT 202   Basic Metabolic Panel Recent Labs    16/10/96 0424  NA 137  K 4.6  CL 97*  CO2 30  GLUCOSE 125*  BUN 16  CREATININE 0.94  CALCIUM 8.4*   Liver Function Tests No results for input(s): AST, ALT, ALKPHOS, BILITOT, PROT, ALBUMIN in the last 72 hours. No results for input(s): LIPASE, AMYLASE in the last 72 hours. Cardiac Enzymes No results for input(s): CKTOTAL, CKMB, CKMBINDEX, TROPONINI in the last 72 hours. BNP Invalid input(s): POCBNP D-Dimer No results for input(s): DDIMER in the last 72 hours. Hemoglobin A1C No results for input(s): HGBA1C in the last 72 hours. Fasting Lipid Panel No results for input(s): CHOL, HDL, LDLCALC, TRIG, CHOLHDL, LDLDIRECT in the last 72 hours. Thyroid Function Tests No results for input(s): TSH, T4TOTAL, T3FREE, THYROIDAB in the last 72 hours.  Invalid input(s): FREET3 _____________  No results found. Disposition   Patient is being discharged home today in good condition.  Follow-up Plans & Appointments    Follow-up Information    Call Charlynne Pander, DDS.   Specialty:  Dentistry Why:  Call for appointment for evaluation of healing and suture removal for October 31, 2017 Contact information: [address] [place] [address] [phone]        Alleen Borne, MD Follow up.   Specialty:  Cardiothoracic Surgery Why:  Please keep previously scheduled follow-up appointment with Dr. Laneta Simmers on 10/27/2017 at 3:00pm. Contact information: [address] [place] [address] [phone]          [facility] Follow up.   Specialty:  Radiology Why:  Please keep previously scheduled appoitnment for CT angiogram of chest/abdomen/pelvis and coronary CTA on 10/24/2017 at 10:30am. Contact information: [address] [id number] mc [place] [address] [phone]           Discharge Medications   Allergies as of 10/21/2017   No Known Allergies     Medication List    STOP taking these medications   losartan 100 MG tablet Commonly known as:  COZAAR     TAKE these medications   albuterol 108 (90 Base) MCG/ACT inhaler Commonly known as:  PROVENTIL HFA;VENTOLIN HFA Inhale 2 puffs into the lungs every 4 (four) hours as needed for wheezing or shortness of breath.   aspirin 81 MG tablet Take 81 mg by mouth daily.   diclofenac 75 MG EC tablet Commonly known as:  VOLTAREN Take 75 mg by mouth daily.   DULoxetine 60 MG capsule Commonly known as:  CYMBALTA Take 60 mg by mouth daily.   furosemide 40 MG tablet Commonly known as:  LASIX Take 1 tablet (40 mg total) by mouth 2 (two) times daily. Start taking on:  10/22/2017   gabapentin 300 MG capsule Commonly known as:  NEURONTIN Take 300 mg by mouth 2 (two) times daily as needed (  pain).   metoprolol tartrate 50 MG tablet Commonly known as:  LOPRESSOR Take one tablet by mouth one hour prior to TAVR CT scan on 10/24/2017   oxyCODONE-acetaminophen 5-325 MG tablet Commonly known as:  PERCOCET/ROXICET Take one to 2 tablets by mouth every 6 hours as needed for moderate to severe pain.   potassium chloride SA 20 MEQ tablet Commonly known as:  K-DUR,KLOR-CON Take 1 tablet (20 mEq total) by mouth daily.   pravastatin 40 MG tablet Commonly known as:  PRAVACHOL Take 40 mg by mouth daily.   Vitamin D-3 5000 units Tabs Take 5,000 Units by mouth daily.       Acute coronary syndrome (MI, NSTEMI, STEMI, etc) this admission?: No.    Outstanding Labs/Studies    None  Duration of Discharge Encounter   Greater than 30 minutes including physician time.  Signed, Corrin Parker, PA-C 10/21/2017, 10:40 AM

## 2017-10-21 NOTE — Progress Notes (Addendum)
Nutrition Consult/Education Note  RD consulted for nutrition education regarding post-op dental surgery diet.  RD provided "National Dysphagia Diet Pureed Nutrition Therapy" handout from the Academy of Nutrition and Dietetics. Reviewed tips with pt and wife including pureeing all foods with a blender or food processor; foods should not have lumps or require chewing.   Also recommended adding small amounts gravy, sauce, vegetable juice or cooking water, fruit juice, milk, or half and half to foods, then puree them to the consistency of pudding. RD also mentioned considering drinking oral nutrition supplements (ie Ensure Enlive and/or Boost) between meals to help meet pt's calorie and protein needs.  Pt verbalizes understanding of information provided.  Expect good compliance.  Body mass index is 31.04 kg/(m^2). Pt meets criteria for Obesity Class I based on current BMI.  Patient is discharging today.  Maureen Chatters, RD, LDN Pager #: [phone] After-Hours Pager #: [phone]

## 2017-10-21 NOTE — Progress Notes (Signed)
POST OPERATIVE NOTE:  10/21/2017 Andre Erickson [id number]  VITALS: BP 96/66 (BP Location: Left Arm)   Pulse 86   Temp 98.2 F (36.8 C) (Oral)   Resp 18   Ht 5\' 10"  (1.778 m)   Wt (!) 162.7 kg   SpO2 92%   BMI 51.47 kg/m   LABS:  Lab Results  Component Value Date   WBC 12.1 (H) 10/21/2017   HGB 14.3 10/21/2017   HCT 44.5 10/21/2017   MCV 92.7 10/21/2017   PLT 202 10/21/2017   BMET    Component Value Date/Time   NA 137 10/21/2017 0424   K 4.6 10/21/2017 0424   CL 97 (L) 10/21/2017 0424   CO2 30 10/21/2017 0424   GLUCOSE 125 (H) 10/21/2017 0424   BUN 16 10/21/2017 0424   CREATININE 0.94 10/21/2017 0424   CALCIUM 8.4 (L) 10/21/2017 0424   GFRNONAA >60 10/21/2017 0424   GFRAA >60 10/21/2017 0424    No results found for: INR, PROTIME No results found for: PTT   Andre Erickson is status post extraction of multiple teeth with alveoloplasty, pre-prosthetic surgery, gross debridement remaining dentition in the operating room on 10/20/2017.  SUBJECTIVE: Patient denies having any significant discomfort.  Patient denies having any active bleeding.  EXAM: There is no evidence of heme, or ooze.  Sutures are intact.  Clots are present.  Intraoral and extraoral ecchymoses are noted.   ASSESSMENT: Post operative course is consistent with dental procedures performed in the operating room with general anesthesia. Loss of teeth due to extraction Multiple missing teeth.  PLAN: 1.  Use salt water rinses as needed every 2 hours while awake to aid healing. 2.  Brush teeth after meals and at bedtime 3.  Advance diet as tolerated to a soft diet as previously discussed and  Nutritional consultation pending. 4.  Patient is acceptable for discharge from a dental standpoint.  Return to dental medicine in 7 to 10 days for evaluation of healing and suture removal.   Charlynne Pander, DDS

## 2017-10-21 NOTE — Progress Notes (Addendum)
Progress Note  Patient Name: Andre Erickson Date of Encounter: 10/21/2017  Primary Cardiologist: Lorine Bears, MD   Subjective   No significant overnight events. Patient notes a brief episode of mild chest pressure last night that he ranks as a 2/10. The pain resolved on its own and he has not had any chest pain since. He also notes some shortness of breath overnight with orthopnea. Patient does have a history of sleep apnea and is not compliant with his CPAP.  Patient mentioned that his BP dropped to 96/66 early this morning. His systolic BP is normally in the 120s mmgHg. Patient was not symptomatic at the time. He denies any lightheadedness or dizziness with ambulation.   Inpatient Medications    Scheduled Meds: . cholecalciferol  5,000 Units Oral Daily  . potassium chloride SA  20 mEq Oral Daily  . pravastatin  40 mg Oral q1800   Continuous Infusions: . lactated ringers     PRN Meds: albuterol, gabapentin, morphine injection, oxyCODONE-acetaminophen   Vital Signs    Vitals:   10/20/17 1316 10/20/17 2024 10/21/17 0351 10/21/17 0352  BP: 111/74   96/66  Pulse: 86     Resp: 18     Temp: 98 F (36.7 C) 98.1 F (36.7 C)  98.2 F (36.8 C)  TempSrc: Oral Oral  Oral  SpO2: 97% 97% 93% 92%  Weight: (!) 162.7 kg     Height:        Intake/Output Summary (Last 24 hours) at 10/21/2017 0819 Last data filed at 10/20/2017 1130 Gross per 24 hour  Intake 1200 ml  Output 150 ml  Net 1050 ml   Filed Weights   10/20/17 0631 10/20/17 1316  Weight: (!) 161.3 kg (!) 162.7 kg    Telemetry    Normal sinus rhythm to mild sinus tachycardia with rate ranging from 60s bpms to low 100s bpm. - Personally Reviewed  Physical Exam   GEN: Morbidly obese 62 year old male resting comfortably in no acute distress.   Neck: Supple. Difficult to assess JVD due to body habitus. Cardiac: RRR. III/VI systolic murmur loudest at left upper sternal border. No rubs or gallops.  Respiratory:  No increased work of breathing. Clear to auscultation bilaterally. GI: Abdomen soft, obese, and non-tender to palpation. Bowel sounds present.  MS: No pitting edema of lower extremities. Neuro:  No focal deficits.  Psych: Normal affect.  Labs    Chemistry Recent Labs  Lab 10/18/17 0847 10/21/17 0424  NA 138 137  K 3.8 4.6  CL 101 97*  CO2 30 30  GLUCOSE 98 125*  BUN 15 16  CREATININE 1.04 0.94  CALCIUM 8.8* 8.4*  GFRNONAA >60 >60  GFRAA >60 >60  ANIONGAP 7 10     Hematology Recent Labs  Lab 10/18/17 0847 10/21/17 0424  WBC 7.1 12.1*  RBC 5.40 4.80  HGB 16.1 14.3  HCT 49.6 44.5  MCV 91.9 92.7  MCH 29.8 29.8  MCHC 32.5 32.1  RDW 13.2 13.4  PLT 235 202    Cardiac EnzymesNo results for input(s): TROPONINI in the last 168 hours. No results for input(s): TROPIPOC in the last 168 hours.   BNPNo results for input(s): BNP, PROBNP in the last 168 hours.   DDimer No results for input(s): DDIMER in the last 168 hours.   Radiology    No results found.  Cardiac Studies   Echocardiogram 09/21/17: Study Conclusions: - Left ventricle: The cavity size was mildly dilated. There was moderate  concentric hypertrophy. Systolic function was moderately to severely reduced. The estimated ejection fraction was in the range of 30% to 35%. Diffuse hypokinesis. Regional wall motion abnormalities cannot be excluded. Features are consistent with a pseudonormal left ventricular filling pattern, with concomitant abnormal relaxation and increased filling pressure (grade 2 diastolic dysfunction). - Aortic valve: Severely calcified leaflets. Unable to exclude bicuspid aortic valve. Transvalvular velocity was increased. There was critical stenosis. There was mild regurgitation. Peak velocity (S): 481 cm/s. Mean gradient (S): 54 mm Hg. Peak gradient (S): 93 mm Hg. Valve area (VTI): 0.62 cm^2. - Left atrium: The atrium was moderately dilated. - Right ventricle:  Systolic function was normal. - Pulmonary arteries: Systolic pressure could not be accurately estimated. _______________  Right/Left Heart Catheterization 09/26/17: Conclusions: 1. Minimal luminal irregularities with no evidence of obstructive coronary artery disease. Difficulty filling the left coronary system likely due to severe aortic stenosis and suboptimal visualization due to morbid obesity. 2. Right heart catheterization showed moderately to severely elevated filling pressures, moderate pulmonary hypertension and normal cardiac output. RA pressure: 14 / 18 mmHg, RV pressure 56 / 10 mmHg, PA pressure 58/29 mmHg, pulmonary capillary wedge pressure is 31 mmHg, cardiac output is 7.44 with a cardiac index of 2.76.  Recommendations: I did not attempt to cross the aortic valve. 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 Recommend aortic valve replacement. The patient is also significantly volume overloaded and I increase his furosemide to 40 mg once daily. We will check basic metabolic profile in 1 week.  Patient Profile     Andre Erickson is a 62 y.o. male with a PMH of severe aortic stenosis undergoing TAVR evaluation with TCTS, chronic systolic CHF (non-ischemic), HTN, HLD, OSA on CPAP, morbid obesity, who presented for teeth extraction.   Assessment & Plan    1. Dental Extraction  - Patient is s/p 16 dental extractions yesterday (10/20/2017) with Dr. Kristin Bruins. Patient was admitted for overnight observation by Cardiology. - Patient completed the Amicar oral rinses as instructed by Dr. Kristin Bruins. Continue salt water rinses every 2 hours while awake. - Advance diet as tolerated. - Will need suture removal in 7-10 days. - OK to continue oxycodone as needed for pain.  2. Severe Aortic Stenosis - Patient is currently undergoing TAVR work-up with Dr. Kirke Corin and TCTS. - Right/Left heart catheterization on 09/26/2017 showed no evidence  of obstructive CAD, severe aortic stenosis, moderately to severely elevated filling pressure, moderate pulmonary hypertension. See full report above. - CT C/A/P and Coronary CTa are scheduled for 10/24/2017. - Follow up with Dr. Laneta Simmers on 10/27/2017.  3. Acute Systolic Congestive Heart Failure - Echo on 09/21/2017 showed LVEF of 30-35% with diffuse hypokinesis and grade 2 diastolic dysfunction; critical aortic stenosis with mean gradient of 54 mmHg.  - Patient does not appear volume overloaded on exam. Lungs clear and no significant pitting edema. Patient does notes some shortness of breath overnight and has a history of orthopnea and PND. Although patient also has sleep apnea and is non-compliant with CPAP. - Will hold Lasix and Losartan at this time given BP was 96/66 this morning. Want to minimize hypotension in the setting of critical aortic stenosis. Will discuss dose adjustments of Lasix and Losartan with MD. - Will also continue to hold beta blocker to minimize hypotension.  4. Hypertension - BP is soft but stable. Most recent BP 96/66. - Hold Lasix and Losartan as above.  5. Hyperlipidemia  - Continue Pravastatin 40mg   daily.  6. Morbid Obesity - Planning to refer to cardiac rehab following TAVR.   For questions or updates, please contact [facility] Please consult [email or web address] for contact info under        Signed, Corrin Parker, PA-C  10/21/2017, 8:19 AM    Patient examined chart reviewed oral care good no bleeding taking PO Has CT;s planned for Monday for TAVR evaluation lungs clear AS murmur BP soft D/C ARB with fixed AS and restart lasix over weekend  [employer]

## 2017-10-24 ENCOUNTER — Ambulatory Visit (HOSPITAL_COMMUNITY)
Admission: RE | Admit: 2017-10-24 | Discharge: 2017-10-24 | Disposition: A | Discharge status: Home / Self Care | Payer: Medicaid Other | Source: Ambulatory Visit | Attending: Thoracic Surgery (Cardiothoracic Vascular Surgery) | Admitting: Thoracic Surgery (Cardiothoracic Vascular Surgery)

## 2017-10-24 ENCOUNTER — Encounter (HOSPITAL_COMMUNITY): Payer: Self-pay

## 2017-10-24 ENCOUNTER — Ambulatory Visit (HOSPITAL_BASED_OUTPATIENT_CLINIC_OR_DEPARTMENT_OTHER)
Admission: RE | Admit: 2017-10-24 | Discharge: 2017-10-24 | Disposition: A | Discharge status: Home / Self Care | Payer: Medicaid Other | Source: Ambulatory Visit | Attending: Thoracic Surgery (Cardiothoracic Vascular Surgery) | Admitting: Thoracic Surgery (Cardiothoracic Vascular Surgery)

## 2017-10-24 ENCOUNTER — Encounter (HOSPITAL_COMMUNITY): Payer: Medicaid Other

## 2017-10-24 ENCOUNTER — Other Ambulatory Visit: Payer: Self-pay | Admitting: Thoracic Surgery (Cardiothoracic Vascular Surgery)

## 2017-10-24 DIAGNOSIS — Q231 Congenital insufficiency of aortic valve: Secondary | ICD-10-CM

## 2017-10-24 DIAGNOSIS — J449 Chronic obstructive pulmonary disease, unspecified: Secondary | ICD-10-CM | POA: Insufficient documentation

## 2017-10-24 DIAGNOSIS — R0602 Shortness of breath: Secondary | ICD-10-CM

## 2017-10-24 DIAGNOSIS — I6523 Occlusion and stenosis of bilateral carotid arteries: Secondary | ICD-10-CM | POA: Diagnosis not present

## 2017-10-24 DIAGNOSIS — I350 Nonrheumatic aortic (valve) stenosis: Secondary | ICD-10-CM | POA: Insufficient documentation

## 2017-10-24 DIAGNOSIS — I2510 Atherosclerotic heart disease of native coronary artery without angina pectoris: Secondary | ICD-10-CM | POA: Diagnosis not present

## 2017-10-24 DIAGNOSIS — K5790 Diverticulosis of intestine, part unspecified, without perforation or abscess without bleeding: Secondary | ICD-10-CM | POA: Diagnosis not present

## 2017-10-24 DIAGNOSIS — I700 Atherosclerosis of aorta: Secondary | ICD-10-CM | POA: Diagnosis not present

## 2017-10-24 LAB — PULMONARY FUNCTION TEST
DL/VA % PRED: 153 %
DL/VA: 7.09 ml/min/mmHg/L
DLCO COR % PRED: 96 %
DLCO UNC: 30.8 ml/min/mmHg
DLCO cor: 31.07 ml/min/mmHg
DLCO unc % pred: 95 %
FEF 25-75 Post: 1.44 L/sec
FEF 25-75 Pre: 2.01 L/sec
FEF2575-%Change-Post: -28 %
FEF2575-%PRED-POST: 49 %
FEF2575-%Pred-Pre: 68 %
FEV1-%CHANGE-POST: -7 %
FEV1-%Pred-Post: 58 %
FEV1-%Pred-Pre: 62 %
FEV1-Post: 2.08 L
FEV1-Pre: 2.24 L
FEV1FVC-%Change-Post: 0 %
FEV1FVC-%Pred-Pre: 104 %
FEV6-%Change-Post: -7 %
FEV6-%Pred-Post: 57 %
FEV6-%Pred-Pre: 62 %
FEV6-POST: 2.59 L
FEV6-PRE: 2.81 L
FEV6FVC-%Change-Post: 0 %
FEV6FVC-%PRED-POST: 105 %
FEV6FVC-%PRED-PRE: 105 %
FVC-%CHANGE-POST: -7 %
FVC-%PRED-POST: 54 %
FVC-%Pred-Pre: 59 %
FVC-Post: 2.59 L
FVC-Pre: 2.81 L
Post FEV1/FVC ratio: 80 %
Post FEV6/FVC ratio: 100 %
Pre FEV1/FVC ratio: 80 %
Pre FEV6/FVC Ratio: 100 %
RV % PRED: 101 %
RV: 2.3 L
TLC % PRED: 72 %
TLC: 5.08 L

## 2017-10-24 MED ORDER — ALBUTEROL SULFATE (2.5 MG/3ML) 0.083% IN NEBU
2.5000 mg | INHALATION_SOLUTION | Freq: Once | RESPIRATORY_TRACT | Status: AC
  Administered 2017-10-24: 2.5 mg via RESPIRATORY_TRACT

## 2017-10-24 NOTE — Progress Notes (Signed)
Carotid artery duplex has been completed. 1-39% ICA stenosis bilaterally.  10/24/17 2:05 PM Olen Cordial RVT

## 2017-10-25 ENCOUNTER — Ambulatory Visit: Payer: Medicaid Other | Admitting: Nurse Practitioner

## 2017-10-26 ENCOUNTER — Encounter (HOSPITAL_COMMUNITY): Payer: Self-pay | Admitting: Interventional Radiology

## 2017-10-26 ENCOUNTER — Ambulatory Visit (HOSPITAL_COMMUNITY)
Admission: RE | Admit: 2017-10-26 | Discharge: 2017-10-26 | Disposition: A | Discharge status: Home / Self Care | Payer: Medicaid Other | Source: Ambulatory Visit | Attending: Thoracic Surgery (Cardiothoracic Vascular Surgery) | Admitting: Thoracic Surgery (Cardiothoracic Vascular Surgery)

## 2017-10-26 ENCOUNTER — Other Ambulatory Visit: Payer: Self-pay | Admitting: Thoracic Surgery (Cardiothoracic Vascular Surgery)

## 2017-10-26 DIAGNOSIS — R0602 Shortness of breath: Secondary | ICD-10-CM

## 2017-10-26 DIAGNOSIS — I350 Nonrheumatic aortic (valve) stenosis: Secondary | ICD-10-CM | POA: Insufficient documentation

## 2017-10-26 DIAGNOSIS — I700 Atherosclerosis of aorta: Secondary | ICD-10-CM

## 2017-10-26 DIAGNOSIS — Q231 Congenital insufficiency of aortic valve: Secondary | ICD-10-CM | POA: Insufficient documentation

## 2017-10-26 DIAGNOSIS — I2584 Coronary atherosclerosis due to calcified coronary lesion: Secondary | ICD-10-CM | POA: Insufficient documentation

## 2017-10-26 DIAGNOSIS — I2510 Atherosclerotic heart disease of native coronary artery without angina pectoris: Secondary | ICD-10-CM | POA: Insufficient documentation

## 2017-10-26 DIAGNOSIS — K5730 Diverticulosis of large intestine without perforation or abscess without bleeding: Secondary | ICD-10-CM

## 2017-10-26 HISTORY — PX: IR US GUIDE VASC ACCESS RIGHT: IMG2390

## 2017-10-26 HISTORY — PX: IR RADIOLOGY PERIPHERAL GUIDED IV START: IMG5598

## 2017-10-26 MED ORDER — IOPAMIDOL (ISOVUE-370) INJECTION 76%
100.0000 mL | Freq: Once | INTRAVENOUS | Status: AC | PRN
  Administered 2017-10-26: 100 mL via INTRAVENOUS

## 2017-10-26 MED ORDER — LIDOCAINE HCL (PF) 1 % IJ SOLN
INTRAMUSCULAR | Status: DC | PRN
  Administered 2017-10-26: 4 mL

## 2017-10-26 MED ORDER — IOPAMIDOL (ISOVUE-370) INJECTION 76%: 100.0000 mL | Freq: Once | INTRAVENOUS | Status: DC | PRN

## 2017-10-26 MED ORDER — IOPAMIDOL (ISOVUE-370) INJECTION 76%: 100.0000 mL | Freq: Once | INTRAVENOUS | Status: DC

## 2017-10-26 MED ORDER — LIDOCAINE HCL 1 % IJ SOLN
INTRAMUSCULAR | Status: AC
  Filled 2017-10-26: qty 20

## 2017-10-26 NOTE — Progress Notes (Signed)
Andre Erickson came to have a CTA Cardiac/CTA Chest,Abdomen,Pelvis 10/24/2017.  His IV infiltrated, with 50 ml Isovue 370 infiltrating into his LEFT AC space. Pt was released before a PA or Radiologist was able to examine him. Pt came back today to complete his exams. The IR Radiologist placed his IV. His scans were able to be completed today, and [place], PA saw his arm.

## 2017-10-26 NOTE — Progress Notes (Signed)
Patient s/p infiltrated left AC IV on 10/24/17 with 50 mL Isovue 370 prior to CTA cardiac/CTA chest, abdomen, pelvis - patient was released prior to being seen by PA or MD. He returns today to complete his previously ordered exams and PA was asked to evaluate previous IV infiltration site.  Patient and wife present in IR holding area, IV start in right arm noted. Patient reports that area of infiltration has decreased although it is still slightly painful, but this is improving as well. He denies any noticeable skin changes aside from hardness over area of infiltration. He denies any arm swelling, chest pain or dyspnea.  Exam of left AC shows approximately 2 cm area of induration present, no pain illicited on palpation, no hematoma, active, bleeding, erythema, skin breakdown, open wounds, scabbing or skin color changes noted. Patient able to move arm freely without pain. No edema noted to left upper extremity.  Discussed with patient to continue to monitor area of induration - if it does not continue to improve or worsens patient and wife were instructed to call radiology or present to ED for evaluation. Patient and wife also instructed to monitor for increased swelling, sudden onset chest pain or dyspnea and if they were to occur to present to ED. All questions answered.  Lynnette Caffey, PA-C

## 2017-10-27 ENCOUNTER — Other Ambulatory Visit: Payer: Self-pay

## 2017-10-27 ENCOUNTER — Encounter: Payer: Self-pay | Admitting: Surgery

## 2017-10-27 ENCOUNTER — Institutional Professional Consult (permissible substitution): Payer: Medicaid Other | Admitting: Surgery

## 2017-10-27 ENCOUNTER — Ambulatory Visit: Payer: Medicaid Other | Attending: Surgery | Admitting: Physical Therapy

## 2017-10-27 ENCOUNTER — Encounter: Payer: Self-pay | Admitting: Physical Therapy

## 2017-10-27 VITALS — BP 128/80 | HR 87 | Resp 18 | Ht 70.0 in | Wt 349.0 lb

## 2017-10-27 DIAGNOSIS — I350 Nonrheumatic aortic (valve) stenosis: Secondary | ICD-10-CM | POA: Diagnosis not present

## 2017-10-27 DIAGNOSIS — R2689 Other abnormalities of gait and mobility: Secondary | ICD-10-CM | POA: Insufficient documentation

## 2017-10-27 NOTE — Therapy (Signed)
[facility] [address] [place], [place], [address] Phone: [phone]   Fax:  [phone]  Physical Therapy Evaluation  Patient Details  Name: Andre Erickson MRN: [id number] Date of Birth: [birth year] Referring Provider (PT): Evelene Croon MD   Encounter Date: 10/27/2017  PT End of Session - 10/27/17 1412    Visit Number  1    Number of Visits  1    Date for PT Re-Evaluation  10/27/17    PT Start Time  1335    PT Stop Time  1411    PT Time Calculation (min)  36 min    Activity Tolerance  Patient tolerated treatment well    Behavior During Therapy  [facility] for tasks assessed/performed       Past Medical History:  Diagnosis Date  . Aortic valve stenosis   . Arthritis   . Asthma    as a child  . GERD (gastroesophageal reflux disease)   . Hyperlipidemia   . Hypertension   . Morbid obesity (HCC)   . Murmur   . Sleep apnea    trying to use the CPAP    Past Surgical History:  Procedure Laterality Date  . APPENDECTOMY    . CARDIAC CATHETERIZATION  09/26/2017  . CHOLECYSTECTOMY    . IR RADIOLOGY PERIPHERAL GUIDED IV START  10/26/2017  . IR US GUIDE VASC ACCESS RIGHT  10/26/2017  . MULTIPLE EXTRACTIONS WITH ALVEOLOPLASTY  10/20/2017  . MULTIPLE EXTRACTIONS WITH ALVEOLOPLASTY N/A 10/20/2017   Procedure: Extraction of tooth #'s 2,7-10, 12,13,15,20,22,23,25, 26, 28, 29, and 32 with alveoloplasty, bilateral mandibular tori reductions, bilateral maxillary lateral exostoses reductions, and gross debridement of remaining teeth;  Surgeon: Charlynne Pander, DDS;  Location: [facility] OR;  Service: Oral Surgery;  Laterality: N/A;  . tooth removal  10/20/2017    There were no vitals filed for this visit.   Subjective Assessment - 10/27/17 1341    Subjective  pt is a 62 y.o M with CC of SOB and pain in the chest that he reports is going on all the time which he attributes to his body mass which has been progressively getting worse over  the last year and really increased over the last 3 months per his wife. He reports he has been trying to do more exercise and has been very out of breath and was very dizzy.  reports he does have occasional pain in the knees due to bone on bone but reports none today.     How long can you sit comfortably?  unlimited    How long can you stand comfortably?  5 min     How long can you walk comfortably?  5-10 min    Patient Stated Goals  to get heart better    Currently in Pain?  No/denies         [facility] PT Assessment - 10/27/17 1344      Assessment   Medical Diagnosis  severe aortic stenosis    Referring Provider (PT)  Evelene Croon MD    Onset Date/Surgical Date  --   1 year   Hand Dominance  Right      Precautions   Precautions  None      Restrictions   Weight Bearing Restrictions  No      Balance Screen   Has the patient fallen in the past 6 months  No    Has the patient had a decrease in activity level because of a fear  of falling?   No    Is the patient reluctant to leave their home because of a fear of falling?   No      Home Public house manager residence    Living Arrangements  Spouse/significant other    Available Help at Discharge  Family    Type of Home  House    Home Access  Stairs to enter    Entrance Stairs-Number of Steps  2    Entrance Stairs-Rails  None    Home Layout  One level    Home Equipment  [place] - single point;Walker - standard      ROM / Strength   AROM / PROM / Strength  AROM;Strength      AROM   Overall AROM Comments  limited Lshoulder reaching behind head and reaching behind the back.       Strength   Overall Strength Comments  weakness in R knee extensors and ankle DF compared bil,     Strength Assessment Site  Hand    Right Hand Grip (lbs)  54    Left Hand Grip (lbs)  68       [facility] Pre-Surgical Assessment - 10/27/17 0001    5 Meter Walk Test- trial 1  6 sec    5 Meter Walk Test- trial 2  6 sec.     5 Meter Walk  Test- trial 3  6 sec.    5 meter walk test average  6 sec    4 Stage Balance Test tolerated for:   2 sec.    4 Stage Balance Test Position  4    Sit To Stand Test- trial 1  19 sec.    Comment  verbal cues required to avoid holding breath    ADL/IADL Independent with:  Bathing;Dressing;Meal prep;Finances    ADL/IADL Needs Assistance with:  P[place] Badder work    ADL/IADL Fraility Index  Midly frail    6 Minute Walk- Baseline  yes    BP (mmHg)  114/90    HR (bpm)  75    02 Sat (%RA)  93 %    Modified Borg Scale for Dyspnea  0- Nothing at all    Perceived Rate of Exertion (Borg)  7- Very, very light    6 Minute Walk Post Test  yes    BP (mmHg)  142/90    HR (bpm)  120    02 Sat (%RA)  92 %    Modified Borg Scale for Dyspnea  7- Severe shortness of breath or very hard breathing    Perceived Rate of Exertion (Borg)  15- Hard    Aerobic Endurance Distance Walked  763    Endurance additional comments  pt demonstrated 59.33% limitation compared to age related norm              Objective measurements completed on examination: See above findings.                           Plan - 10/27/17 1412    Clinical Impression Statement  see assessment in note    Rehab Potential  Good    PT Frequency  One time visit    PT Next Visit Plan  pre-TAVR evaluation    Consulted and Agree with Plan of Care  Patient         Clinical Impression Statement: Pt is a 62 yo M presenting to OP PT  for evaluation prior to possible TAVR surgery due to severe aortic stenosis. Pt reports onset of SOB and dizziness approximately 1 year ago with progressive worsening 3 months ago. Symptoms are limiting endurance. Pt presents with fiunction ROM except for LUE with reaching behind his head and back and strength except for R knee extension and ankle DF, functional balance and is mild at high fall risk 4 stage balance test, good walking speed and limited aerobic endurance per 6 minute walk test. Pt  ambulated 753 feet in 4:50 before requesting a seated rest beak lasting 60 seconds. At time of rest, patient's HR was 120 bpm and O2 was 92 on room air. Pt reported 7/10 shortness of breath on modified scale for dyspnea. Pt able to resume after rest and ambulate an additional 10 feet. Pt ambulated a total of 763 feet in 6 minute walk. SOB and hip pain  increased significantly with 6 minute walk test. Based on the Short Physical Performance Battery, patient has a frailty rating of 9/12 with </= 5/12 considered frail.    Patient demonstrated  the following deficits and impairments:     Visit Diagnosis: Other abnormalities of gait and mobility     Problem List Patient Active Problem List   Diagnosis Date Noted  . HFrEF (heart failure with reduced ejection fraction) (HCC) 10/21/2017  . Severe aortic stenosis 10/20/2017  . Other dental procedure status 10/20/2017  . Aortic stenosis, severe   . 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 Type 2 diabetes mellitus without complications (HCC) 09/10/2013  . Cardiac murmur, unspecified 09/03/2013  . Radiculopathy of lumbar region 03/05/2013  . Disorder of skin or subcutaneous tissue 10/06/2012  . Osteoarthritis 05/10/2012  . Vitamin D deficiency 09/14/2011  . Essential (primary) hypertension 09/13/2011  . Hyperlipidemia 09/13/2011  . Obesity 09/13/2011  . Tinea pedis 09/13/2011   Lulu Riding PT, DPT, LAT, ATC  10/27/17  2:17 PM      [facility] [address] [place], [place], [address] Phone: [phone]   Fax:  [phone]  Name: Andre Erickson MRN: [id number] Date of Birth: [birth year]

## 2017-10-27 NOTE — Progress Notes (Signed)
[facility]  MULTIDISCIPLINARY HEART VALVE CLINIC  CARDIOTHORACIC SURGERY CONSULTATION REPORT  Referring Provider is Iran Ouch, MD PCP is Dala Dock, [place]  Chief Complaint  Patient presents with  . Aortic Stenosis    New patient consultation, review all studies    HPI:  The patient is a 62 year old Hispanic gentleman with a history of hypertension, hyperlipidemia, morbid obesity, sleep apnea, GERD, and severe degenerative arthritis of both knees who had a long-standing heart murmur.  Over the past year he has developed exertional fatigue and shortness of breath as well as swelling in his legs.  The symptoms have progressed over the past couple months and he has been having some shortness of breath at rest, orthopnea, chest tightness and dizziness.  He has had some fluid weeping from blisters in his legs due to the edema.  This is improved somewhat with Lasix.  He was referred to Dr. Kirke Corin and underwent an echocardiogram showing critical aortic valve stenosis with a mean gradient of 58 mmHg and a peak of 93 mmHg.  The valve area is calculated at 0.6 cm.  Left ventricular ejection fraction was reduced to 30 to 35% with diffuse hypokinesis.  There is grade 2 diastolic dysfunction.  Cardiac catheterization was performed and showed no significant coronary disease.  The patient is here with his wife today.  He is originally from [place]  He understands English well and speaks some [place].  His wife is fluent in [place].  Past Medical History:  Diagnosis Date  . Aortic valve stenosis   . Arthritis   . Asthma    as a child  . GERD (gastroesophageal reflux disease)   . Hyperlipidemia   . Hypertension   . Morbid obesity (HCC)   . Murmur   . Sleep apnea    trying to use the CPAP    Past Surgical History:  Procedure Laterality Date  . APPENDECTOMY    . CARDIAC CATHETERIZATION  09/26/2017  . CHOLECYSTECTOMY    . IR RADIOLOGY PERIPHERAL GUIDED IV START   10/26/2017  . IR US GUIDE VASC ACCESS RIGHT  10/26/2017  . MULTIPLE EXTRACTIONS WITH ALVEOLOPLASTY  10/20/2017  . MULTIPLE EXTRACTIONS WITH ALVEOLOPLASTY N/A 10/20/2017   Procedure: Extraction of tooth #'s 2,7-10, 12,13,15,20,22,23,25, 26, 28, 29, and 32 with alveoloplasty, bilateral mandibular tori reductions, bilateral maxillary lateral exostoses reductions, and gross debridement of remaining teeth;  Surgeon: Charlynne Pander, DDS;  Location: [facility] OR;  Service: Oral Surgery;  Laterality: N/A;  . tooth removal  10/20/2017    Family History  Problem Relation Age of Onset  . Cancer Mother   . Colon cancer Father   . Rectal cancer Father   . Melanoma Father   . Atrial fibrillation Father   . Macular degeneration Father   . Glaucoma Father   . Skin cancer Father   . CAD Father   . Transient ischemic attack Father     Social History   Socioeconomic History  . Marital status: Married    Spouse name: Lanora Manis   . Number of children: 4  . Years of education: Not on file  . Highest education level: Not on file  Occupational History  . Occupation: disabled  Social Needs  . Financial resource strain: Not on file  . Food insecurity:    Worry: Not on file    Inability: Not on file  . Transportation needs:    Medical: Not on file    Non-medical: Not on file  Tobacco Use  . Smoking status: Former Smoker    Last attempt to quit: 09/27/1978    Years since quitting: 39.1  . Smokeless tobacco: Never Used  Substance and Sexual Activity  . Alcohol use: Not Currently  . Drug use: Never  . Sexual activity: Not on file  Lifestyle  . Physical activity:    Days per week: 0 days    Minutes per session: Not on file  . Stress: Rather much  Relationships  . Social connections:    Talks on phone: Once a week    Gets together: Once a week    Attends religious service: Never    Active member of club or organization: No    Attends meetings of clubs or organizations: Never    Relationship  status: Not on file  . Intimate partner violence:    Fear of current or ex partner: No    Emotionally abused: No    Physically abused: No    Forced sexual activity: No  Other Topics Concern  . Not on file  Social History Narrative  . Not on file    Current Outpatient Medications  Medication Sig Dispense Refill  . albuterol (PROVENTIL HFA;VENTOLIN HFA) 108 (90 Base) MCG/ACT inhaler Inhale 2 puffs into the lungs every 4 (four) hours as needed for wheezing or shortness of breath.    Marland Kitchen aspirin 81 MG tablet Take 81 mg by mouth daily.     . Cholecalciferol (VITAMIN D-3) 5000 units TABS Take 5,000 Units by mouth daily.    . diclofenac (VOLTAREN) 75 MG EC tablet Take 75 mg by mouth daily.     . DULoxetine (CYMBALTA) 60 MG capsule Take 60 mg by mouth daily.    . furosemide (LASIX) 40 MG tablet Take 1 tablet (40 mg total) by mouth 2 (two) times daily. 180 tablet 1  . gabapentin (NEURONTIN) 300 MG capsule Take 300 mg by mouth 2 (two) times daily as needed (pain).     . metoprolol tartrate (LOPRESSOR) 50 MG tablet Take one tablet by mouth one hour prior to TAVR CT scan on 10/24/2017 1 tablet 0  . oxyCODONE-acetaminophen (PERCOCET) 5-325 MG tablet Take one to 2 tablets by mouth every 6 hours as needed for moderate to severe pain. 40 tablet 0  . potassium chloride SA (K-DUR,KLOR-CON) 20 MEQ tablet Take 1 tablet (20 mEq total) by mouth daily. 90 tablet 1  . pravastatin (PRAVACHOL) 40 MG tablet Take 40 mg by mouth daily.     No current facility-administered medications for this visit.     No Known Allergies    Review of Systems:   General:  normal appetite, decreased energy, no weight gain, + weight loss, no fever  Cardiac:  + chest pain with exertion, no chest pain at rest, +SOB with mild exertion, + resting SOB, no PND, + orthopnea, no palpitations, no arrhythmia, no atrial fibrillation, + LE edema, + dizzy spells, no syncope  Respiratory:  + shortness of breath, no home oxygen, no productive  cough, no dry cough, no bronchitis, no wheezing, no hemoptysis, + asthm as a child, no pain with inspiration or cough, + sleep apnea, + CPAP at night  GI:   no difficulty swallowing, + reflux, no frequent heartburn, no hiatal hernia, no abdominal pain, no constipation, no diarrhea, no hematochezia, no hematemesis, no melena  GU:   no dysuria,  no frequency, no urinary tract infection, no hematuria, no enlarged prostate, no kidney stones, no kidney disease  Vascular:  no pain suggestive of claudication, + pain in feet, no leg cramps, no varicose veins, no DVT, no non-healing foot ulcer  Neuro:   no stroke, no TIA's, no seizures, no headaches, no temporary blindness one eye,  no slurred speech, no peripheral neuropathy, no chronic pain, no instability of gait, no memory/cognitive dysfunction  Musculoskeletal: + arthritis, no joint swelling, no myalgias, no difficulty walking but uses a cane, decreased mobility   Skin:   no rash, no itching, no skin infections, no pressure sores or ulcerations  Psych:   no anxiety, no depression, no nervousness, no unusual recent stress  Eyes:   no blurry vision, no floaters, no recent vision changes, + wears glasses or contacts  ENT:   no hearing loss, + loose or painful teeth recently removed, no dentures, last saw dentist recently for extractions.  Hematologic:  no easy bruising, no abnormal bleeding, no clotting disorder, no frequent epistaxis  Endocrine:  no diabetes, does not check CBG's at home        Physical Exam:   BP 128/80 (BP Location: Left Arm, Patient Position: Sitting, Cuff Size: Normal)   Pulse 87   Resp 18   Ht 5\' 10"  (1.778 m)   Wt (!) 349 lb (158.3 kg)   SpO2 93% Comment: RA  BMI 50.08 kg/m   General:  Morbidly obese gentleman in no distress.    HEENT:  Unremarkable, NCAT, PERLA, EOMI, oropharynx clear. Few remaining teeth. Extraction sites healing.  Neck:   no JVD, no bruits, no adenopathy or thyromegaly.  Chest:   clear to  auscultation, symmetrical breath sounds, no wheezes, no rhonchi   CV:   RRR, grade III/VI crescendo/decrescendo murmur heard best at RSB,  no diastolic murmur  Abdomen:  soft, obese, non-tender, no masses.  Extremities:  warm, well-perfused, pulses not palpable, marked LE edema bilaterally with some weeping blisters. No infection.  Rectal/GU  Deferred  Neuro:   Grossly non-focal and symmetrical throughout    Diagnostic Tests:            [facility]                  [address]                        [place], [place] [address]                            [phone]  ------------------------------------------------------------------- Echocardiography  Patient:    Mardell, Cragg MR #:       [id number] Study Date: 09/21/2017 Gender:     M Age:        61 Height:     177.8 cm Weight:     165.6 kg BSA:        2.95 m^2 Pt. Status: Room:   ATTENDING    Default, Provider [phone]  Lessie Dings, MD  REFERRING    Lorine Bears, MD  PERFORMING   [place], [place]  SONOGRAPHER  Quentin Ore, RVT, RDCS, RDMS  cc:  ------------------------------------------------------------------- LV EF: 30% -   35%  ------------------------------------------------------------------- Indications:      Murmur (R01.1).  ------------------------------------------------------------------- History:   PMH:   Dyspnea and murmur.  Angina pectoris.  Risk factors:  Edema, OSA. Hypertension. Obese. Dyslipidemia.  ------------------------------------------------------------------- Study Conclusions  - Left ventricle: The cavity size was mildly dilated. There was  moderate concentric hypertrophy. Systolic function was moderately   to severely reduced. The estimated ejection fraction was in the   range of 30% to 35%. Diffuse hypokinesis. Regional wall motion   abnormalities cannot be excluded. Features are consistent with a   pseudonormal left ventricular  filling pattern, with concomitant   abnormal relaxation and increased filling pressure (grade 2   diastolic dysfunction). - Aortic valve: Severely calcified leaflets. Unable to exclude   bicuspid aortic valve. Transvalvular velocity was increased.   There was critical stenosis. There was mild regurgitation. Peak   velocity (S): 481 cm/s. Mean gradient (S): 54 mm Hg. Peak   gradient (S): 93 mm Hg. Valve area (VTI): 0.62 cm^2. - Left atrium: The atrium was moderately dilated. - Right ventricle: Systolic function was normal. - Pulmonary arteries: Systolic pressure could not be accurately   estimated.  ------------------------------------------------------------------- Study data:  No prior study was available for comparison.  Study status:  Routine.  Procedure:  Transthoracic echocardiography. Image quality was fair. Intravenous contrast (Definity) was administered.  Study completion:  There were no complications.     Echocardiography.  M-mode, complete 2D, spectral Doppler, and color Doppler.  Birthdate:  Patient birthdate: 02/25/1955.  Age: Patient is 62 yr old.  Sex:  Gender: male.    BMI: 52.4 kg/m^2. Blood pressure:     132/74  Patient status:  Outpatient.  Study date:  Study date: 09/21/2017. Study time: 07:27 AM.  -------------------------------------------------------------------  ------------------------------------------------------------------- Left ventricle:  The cavity size was mildly dilated. There was moderate concentric hypertrophy. Systolic function was moderately to severely reduced. The estimated ejection fraction was in the range of 30% to 35%. Diffuse hypokinesis. Regional wall motion abnormalities cannot be excluded. Features are consistent with a pseudonormal left ventricular filling pattern, with concomitant abnormal relaxation and increased filling pressure (grade 2 diastolic  dysfunction).  ------------------------------------------------------------------- Aortic valve:  Poorly visualized. Severely calcified leaflets. Unable to exclude bicuspid aortic valve. Mobility was not restricted.  Doppler:  Transvalvular velocity was increased. There was critical stenosis. There was mild regurgitation.    VTI ratio of LVOT to aortic valve: 0.18. Valve area (VTI): 0.62 cm^2. Indexed valve area (VTI): 0.21 cm^2/m^2. Mean velocity ratio of LVOT to aortic valve: 0.16. Valve area (Vmean): 0.56 cm^2. Indexed valve area (Vmean): 0.19 cm^2/m^2.    Mean gradient (S): 54 mm Hg. Peak gradient (S): 93 mm Hg.  ------------------------------------------------------------------- Aorta:  Aortic root: The aortic root was normal in size. Ascending aorta: The ascending aorta was mildly dilated, 3.8 cm  ------------------------------------------------------------------- Mitral valve:   Calcified annulus. Mobility was not restricted. Doppler:  Transvalvular velocity was within the normal range. There was no evidence for stenosis. There was no regurgitation.    Valve area by pressure half-time: 5.95 cm^2. Indexed valve area by pressure half-time: 2.02 cm^2/m^2.    Peak gradient (D): 5 mm Hg.   ------------------------------------------------------------------- Left atrium:  The atrium was moderately dilated.  ------------------------------------------------------------------- Right ventricle:  The cavity size was normal. Wall thickness was normal. Systolic function was normal.  ------------------------------------------------------------------- Pulmonic valve:    Doppler:  Transvalvular velocity was within the normal range. There was no evidence for stenosis.  ------------------------------------------------------------------- Tricuspid valve:   Structurally normal valve.    Doppler: Transvalvular velocity was within the normal range. There was  no regurgitation.  ------------------------------------------------------------------- Pulmonary artery:   The main pulmonary artery was normal-sized. Systolic pressure could not be accurately estimated.  ------------------------------------------------------------------- Right atrium:  The atrium was normal in size.  ------------------------------------------------------------------- Pericardium:  There  was no pericardial effusion.  ------------------------------------------------------------------- Systemic veins: Inferior vena cava: The vessel was normal in size.  ------------------------------------------------------------------- Measurements   Left ventricle                            Value          Reference  LV ID, ED, PLAX chordal           (H)     59    mm       43 - 52  LV ID, ES, PLAX chordal           (H)     47    mm       23 - 38  LV fx shortening, PLAX chordal    (L)     20    %        >=29  LV PW thickness, ED                       17    mm       ---------  IVS/LV PW ratio, ED                       1.06           <=1.3  Stroke volume, 2D                         69    ml       ---------  Stroke volume/bsa, 2D                     23    ml/m^2   ---------  LV e&', lateral                            3.26  cm/s     ---------  LV E/e&', lateral                          35.28          ---------  LV e&', medial                             5.33  cm/s     ---------  LV E/e&', medial                           21.58          ---------  LV e&', average                            4.3   cm/s     ---------  LV E/e&', average                          26.78          ---------    Ventricular septum                        Value          Reference  IVS thickness, ED  18    mm       ---------    LVOT                                      Value          Reference  LVOT ID, S                                21    mm       ---------  LVOT area                                  3.46  cm^2     ---------  LVOT ID                                   21    mm       ---------  LVOT mean velocity, S                     54.8  cm/s     ---------  LVOT VTI, S                               20    cm       ---------  Stroke volume (SV), LVOT DP               69.3  ml       ---------  Stroke index (SV/bsa), LVOT DP            23.5  ml/m^2   ---------    Aortic valve                              Value          Reference  Aortic valve peak velocity, S             481   cm/s     ---------  Aortic valve mean velocity, S             339   cm/s     ---------  Aortic valve VTI, S                       112   cm       ---------  Aortic mean gradient, S                   54    mm Hg    ---------  Aortic peak gradient, S                   93    mm Hg    ---------  VTI ratio, LVOT/AV                        0.18           ---------  Aortic valve area, VTI  0.62  cm^2     ---------  Aortic valve area/bsa, VTI                0.21  cm^2/m^2 ---------  Velocity ratio, mean, LVOT/AV             0.16           ---------  Aortic valve area, mean velocity          0.56  cm^2     ---------  Aortic valve area/bsa, mean               0.19  cm^2/m^2 ---------  velocity    Aorta                                     Value          Reference  Aortic root ID, ED                        32    mm       ---------  Ascending aorta ID, A-P, S                38    mm       ---------    Left atrium                               Value          Reference  LA ID, A-P, ES                            52    mm       ---------  LA ID/bsa, A-P                            1.76  cm/m^2   <=2.2  LA volume, ES, 1-p A4C                    118   ml       ---------  LA volume/bsa, ES, 1-p A4C                40    ml/m^2   ---------    Mitral valve                              Value          Reference  Mitral E-wave peak velocity               115   cm/s     ---------  Mitral A-wave peak  velocity               68.4  cm/s     ---------  Mitral deceleration time          (L)     127   ms       150 - 230  Mitral pressure half-time                 37    ms       ---------  Mitral peak gradient, D  5     mm Hg    ---------  Mitral E/A ratio, peak                    1.7            ---------  Mitral valve area, PHT, DP                5.95  cm^2     ---------  Mitral valve area/bsa, PHT, DP            2.02  cm^2/m^2 ---------    Right ventricle                           Value          Reference  TAPSE                                     36.2  mm       ---------  RV s&', lateral, S                         11.1  cm/s     ---------    Pulmonic valve                            Value          Reference  Pulmonic valve peak velocity, S           132   cm/s     ---------  Legend: (L)  and  (H)  mark values outside specified reference range.  ------------------------------------------------------------------- Prepared and Electronically Authenticated by  Dossie Arbour, MD, [facility] 2019-09-04T11:29:57  Fenton Malling [facility]  CARDIAC CATHETERIZATION  Order# [id number]  Reading physician: Iran Ouch, MD Ordering physician: Iran Ouch, MD Study date: 09/26/17  Physicians   Panel Physicians Referring Physician Case Authorizing Physician  Iran Ouch, MD (Primary)    Procedures   RIGHT HEART CATH AND CORONARY ANGIOGRAPHY  Conclusion   1.  Minimal luminal irregularities with no evidence of obstructive coronary artery disease.  Difficulty filling the left coronary system likely due to severe aortic stenosis and suboptimal visualization due to morbid obesity. 2.  Right heart catheterization showed moderately to severely elevated filling pressures, moderate pulmonary hypertension and normal cardiac output.  RA pressure: 14 / 18 mmHg, RV pressure 56 / 10 mmHg, PA pressure 58/29 mmHg, pulmonary capillary wedge pressure is 31 mmHg, cardiac output is 7.44 with a  cardiac index of 2.76.  Recommendations: I did not attempt to cross the aortic valve.  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 Recommend aortic valve replacement. The patient is also significantly volume overloaded and I increase his furosemide to 40 mg once daily.  We will check basic metabolic profile in 1 week.   Indications   Aortic stenosis, severe [I35.0 (ICD-10-CM)]  Procedural Details/Technique   Technical Details Procedural Details: The pre-existing IV in the right antecubital vein was exchanged under sterile fashion to a slender sheath. The right wrist was prepped, draped, and anesthetized with 1% lidocaine. Using the modified Seldinger technique, a 5 French sheath was introduced into the right radial artery. 2.5 mg of verapamil was administered through the sheath, weight-based unfractionated heparin was administered intravenously.  Right heart catheterization was performed using a 5 French Swan-Ganz catheter. Cardiac output was calculated by the Fick method. A Jackie catheter was used for selective coronary angiography. A JR4 catheter was needed to engage the right coronary artery. I did not attempt to cross the aortic valve. Catheter exchanges were performed over an exchange length guidewire. There were no immediate procedural complications. A TR band was used for radial hemostasis at the completion of the procedure. The patient was transferred to the post catheterization recovery area for further monitoring.   Estimated blood loss <50 mL.  During this procedure the patient was administered the following to achieve and maintain moderate conscious sedation: Versed 1 mg, Fentanyl 50 mcg, while the patient's heart rate, blood pressure, and oxygen saturation were continuously monitored. The period of conscious sedation was 29 minutes, of which I was present face-to-face 100% of this time.  Coronary Findings   Diagnostic  Dominance: Right   Left Main  Vessel is angiographically normal.  Left Anterior Descending  The vessel exhibits minimal luminal irregularities.  First Diagonal Branch  There is mild disease in the vessel.  First Septal Branch  Left Circumflex  The vessel exhibits minimal luminal irregularities.  Second Obtuse Marginal Branch  The vessel exhibits minimal luminal irregularities.  Right Coronary Artery  Vessel is large. Vessel is angiographically normal.  Right Posterior Descending Artery  Vessel is angiographically normal.  Right Posterior Atrioventricular Branch  Vessel is angiographically normal.  Intervention   No interventions have been documented.  Coronary Diagrams   Diagnostic Diagram       Implants    No implant documentation for this case.  MERGE Images   Show images for CARDIAC CATHETERIZATION   Link to Procedure Log   Procedure Log    Hemo Data 09/26/17 1146--09/26/17 1236 before discharge   AO Systolic Cath Pressure AO Diastolic Cath Pressure AO Mean Cath Pressure PA Systolic Cath Pressure PA Diastolic Cath Pressure PA Mean Cath Pressure RA Wedge A Wave RA Wedge V Wave RV Systolic Cath Pressure RV Diastolic Cath Pressure RV End Diastolic PCW A Wave PCW V Wave PCW Mean AO O2 Sat PA O2 Sat AO O2 Sat Fick C.O. Fick C.I.  - - - - - - 18 mmHg 16 mmHg - - - 34 mmHg 43 mmHg 31 mmHg - - - 7.44 L/min 2.76 L/min/m2  - - - - - - - - 54 mmHg 10 mmHg 19 mmHg - - - - - - - -  - - - 58 mmHg 29 mmHg 43 mmHg - - - - - - - - - - - - -  99 67 mmHg 76 mmHg - - - - - - - - - - - - - - - -  106 73 mmHg 88 mmHg - - - - - - - - - - - - - - - -  - - - - - - - - - - - - - - 91.6 % - SA - -  - - - - - - - - - - - - - - - MV - - -   CLINICAL DATA:  62 year old male with history of severe aortic stenosis. Preprocedural study prior to potential transcatheter aortic valve replacement (TAVR) procedure.  EXAM: CT ANGIOGRAPHY CHEST, ABDOMEN AND PELVIS  TECHNIQUE: Multidetector CT imaging through the  chest, abdomen and pelvis was performed using the standard protocol during bolus administration of intravenous contrast. Multiplanar reconstructed images and MIPs were obtained and  reviewed to evaluate the vascular anatomy.  CONTRAST:  ISOVUE-370 IOPAMIDOL (ISOVUE-370) INJECTION 76%, <See Chart> ISOVUE-370 IOPAMIDOL (ISOVUE-370) INJECTION 76%  COMPARISON:  Chest CT 08/19/2006. No prior CT the abdomen and pelvis.  FINDINGS: CTA CHEST FINDINGS  Cardiovascular: Heart size is mildly enlarged. There is no significant pericardial fluid, thickening or pericardial calcification. There is aortic atherosclerosis, as well as atherosclerosis of the great vessels of the mediastinum and the coronary arteries, including calcified atherosclerotic plaque in the left anterior descending and right coronary arteries. Severe thickening calcification of the aortic valve.  Mediastinum/Lymph Nodes: No pathologically enlarged mediastinal or hilar lymph nodes. Esophagus is unremarkable in appearance. No axillary lymphadenopathy.  Lungs/Pleura: Patchy areas of ground-glass attenuation interspersed with areas of lucency throughout the lungs bilaterally, favored to reflect air trapping from small airways disease. No acute consolidative airspace disease. No pleural effusions. No suspicious appearing pulmonary nodules or masses are noted.  Musculoskeletal/Soft Tissues: There are no aggressive appearing lytic or blastic lesions noted in the visualized portions of the skeleton.  CTA ABDOMEN AND PELVIS FINDINGS  Hepatobiliary: Diffuse low attenuation throughout the hepatic parenchyma, indicative of hepatic steatosis. No suspicious cystic or solid hepatic lesions. No intra or extrahepatic biliary ductal dilatation. Status post cholecystectomy.  Pancreas: No pancreatic mass. No pancreatic ductal dilatation. No pancreatic or peripancreatic fluid or inflammatory changes.  Spleen:  Unremarkable.  Adrenals/Urinary Tract: Bilateral kidneys and bilateral adrenal glands are normal in appearance. No hydroureteronephrosis. Urinary bladder is normal in appearance.  Stomach/Bowel: Normal appearance of the stomach. No pathologic dilatation of small bowel or colon. Extensive colonic diverticulosis, without surrounding inflammatory changes to suggest an acute diverticulitis at this time. The appendix is not confidently identified and may be surgically absent. Regardless, there are no inflammatory changes noted adjacent to the cecum to suggest the presence of an acute appendicitis at this time.  Vascular/Lymphatic: Aortic atherosclerosis, without evidence of aneurysm or dissection in the abdominal or pelvic vasculature. Vascular findings and measurements pertinent to potential TAVR procedure, as detailed below. No lymphadenopathy noted in the abdomen or pelvis.  Reproductive: Prostate gland and seminal vesicles are unremarkable in appearance.  Other: No significant volume of ascites.  No pneumoperitoneum.  Musculoskeletal: There are no aggressive appearing lytic or blastic lesions noted in the visualized portions of the skeleton.  VASCULAR MEASUREMENTS PERTINENT TO TAVR:  AORTA:  Minimal Aortic Diameter-17 x 18 mm  Severity of Aortic Calcification-moderate  RIGHT PELVIS:  Right Common Iliac Artery -  Minimal Diameter-13.3 x 11.3 mm  Tortuosity-mild  Calcification-mild-to-moderate  Right External Iliac Artery -  Minimal Diameter-8.5 x 8.3 mm  Tortuosity-severe  Calcification-none  Right Common Femoral Artery -  Minimal Diameter-8.7 x 8.4 mm  Tortuosity-mild  Calcification-mild  LEFT PELVIS:  Left Common Iliac Artery -  Minimal Diameter-10.9 x 12.1 mm  Tortuosity-mild  Calcification-mild-to-moderate  Left External Iliac Artery -  Minimal Diameter-8.5 x 8.2  mm  Tortuosity-severe  Calcification-none  Left Common Femoral Artery -  Minimal Diameter-9.5 x 8.9 mm  Tortuosity-mild  Calcification-mild  Review of the MIP images confirms the above findings.  IMPRESSION: 1. Vascular findings and measurements pertinent to potential TAVR procedure, as detailed above. 2. Severe thickening and calcification of the aortic valve, compatible with the reported clinical history of severe aortic stenosis. 3. Aortic atherosclerosis, in addition to 2 vessel coronary artery disease. Please note that although the presence of coronary artery calcium documents the presence of coronary artery disease, the severity of this disease and any potential stenosis cannot be  assessed on this non-gated CT examination. Assessment for potential risk factor modification, dietary therapy or pharmacologic therapy may be warranted, if clinically indicated. 4. Severe colonic diverticulosis without evidence of acute diverticulitis at this time. 5. Additional incidental findings, as above.  Aortic Atherosclerosis (ICD10-I70.0).   Electronically Signed   By: Trudie Reed M.D.   On: 10/26/2017 16:21  [facility] Adult Cardiac Surgery Database Version 2.9 RISK SCORES Procedure: Isolated AVR CALCULATE  Risk of Mortality:  6.973%   Renal Failure:  9.575%   Permanent Stroke:  1.157%   Prolonged Ventilation:  45.219%   DSW Infection:  0.829%   Reoperation:  7.109%   Morbidity or Mortality:  47.004%   Short Length of Stay:  10.145%   Long Length of Stay:  28.226%    Impression:  This 62 year old super morbidly obese gentleman with a BMI of 50 presents with New York Heart Association class IIIb symptoms of exertional fatigue and shortness of breath as well as orthopnea, recurrent dizziness, and chest pressure with an LVEF of 30 to 35% consistent with combined chronic systolic and diastolic congestive heart failure.  I have personally reviewed his echocardiogram,  cardiac cath, and CTA studies.  His echocardiogram shows poor visualization of the aortic valve and is not possible to tell if it is a bicuspid valve or a trileaflet valve.  There is severe calcification of the leaflets.  The mean gradient across the aortic valve was 54 mmHg. consistent with vertical aortic stenosis.  Left ventricular ejection fraction is moderate to severely reduced at 30 to 35% with global hypokinesis and is most likely related to long-term effects of severe aortic stenosis.  His cardiac catheterization shows no significant coronary disease.  Right heart cath showed moderate pulmonary hypertension at 58/29 with a mean of 43.  His mean wedge pressure at that time was 31.  Cardiac output was 7.44 with a BSA of 2.95 m.  I agree that aortic valve replacement is indicated in this patient.  His STS PROM is 7% putting him in the moderate to high risk category for open surgical AVR likely due to a combination of super morbid obesity with moderate obstructive and restrictive lung disease.  Transcatheter aortic valve replacement may be a possible alternative for him although he is only 62 years old.  There is concern about valve longevity and the patient of this age and no good data.  His valve area does appear relatively small for his body size by echocardiogram.  His gated cardiac CTA has not been officially read will give [place] a better idea of his annular size.  If he has a small annulus he may be better off with transcatheter aortic valve replacement then open surgical AVR.  I think performing annular enlargement or root replacement would certainly put him into the high risk category.  The patient and his wife were counseled at length regarding treatment alternatives for management of severe symptomatic aortic stenosis. The risks and benefits of surgical intervention has been discussed in detail. Long-term prognosis with medical therapy was discussed. Alternative approaches such as conventional  surgical aortic valve replacement, transcatheter aortic valve replacement, and palliative medical therapy were compared and contrasted at length. This discussion was placed in the context of the patient's own specific clinical presentation and past medical history. All of their questions have been addressed. The patient is eager to proceed with treatment as soon as possible.  I think we will need to wait until his gated cardiac CTA has been officially  read so we can make the best decision about treatment for him.  He is not pushing one way or another and wants whatever treatment we feel is the best for him.  The patient has been advised of a variety of complications that might develop including but not limited to risks of death, stroke, paravalvular leak, aortic dissection or other major vascular complications, aortic annulus rupture, device embolization, cardiac rupture or perforation, mitral regurgitation, acute myocardial infarction, arrhythmia, heart block or bradycardia requiring permanent pacemaker placement, congestive heart failure, respiratory failure, renal failure, pneumonia, infection, other late complications related to structural valve deterioration or migration, or other complications that might ultimately cause a temporary or permanent loss of functional independence or other long term morbidity.     Plan:  We will wait until the final reading of his gated cardiac CTA and then will discuss the results with the patient and his wife before making any treatment decisions.  I spent 60 minutes performing this consultation and > 50% of this time was spent face to face counseling and coordinating the care of this patient's critical aortic stenosis.  Alleen Borne, MD 10/27/2017 4:14 PM

## 2017-10-31 ENCOUNTER — Ambulatory Visit (HOSPITAL_COMMUNITY): Payer: Medicaid - Dental | Admitting: Dentistry

## 2017-10-31 ENCOUNTER — Encounter (HOSPITAL_COMMUNITY): Payer: Self-pay | Admitting: Dentistry

## 2017-10-31 VITALS — BP 127/72 | HR 75 | Temp 98.4°F

## 2017-10-31 DIAGNOSIS — I350 Nonrheumatic aortic (valve) stenosis: Secondary | ICD-10-CM | POA: Diagnosis not present

## 2017-10-31 DIAGNOSIS — K08409 Partial loss of teeth, unspecified cause, unspecified class: Secondary | ICD-10-CM

## 2017-10-31 DIAGNOSIS — K08199 Complete loss of teeth due to other specified cause, unspecified class: Secondary | ICD-10-CM

## 2017-10-31 DIAGNOSIS — Z01818 Encounter for other preprocedural examination: Secondary | ICD-10-CM

## 2017-10-31 MED ORDER — CHLORHEXIDINE GLUCONATE 0.12 % MT SOLN: OROMUCOSAL | 99 refills | Status: DC

## 2017-10-31 NOTE — Patient Instructions (Addendum)
PLAN: 1. Use chlorhexidine rinses twice daily as prescribed. Continue salt water rinses as needed in between the chlorhexidine rinses to aid healing. 2. Brush after meals and at bedtime. Suggest using sensitive toothpaste. 3. Advance diet as tolerated 4. Follow-up with the general dentist of his choice for fabrication of upper and lower partial dentures after adequate healing Of at least one month. 5. Patient is cleared for heart valve surgery 6. Patient will require antibiotic premedication prior to invasive dental procedures after the heart valve surgery as per American Heart Association guidelines.  Andre Erickson, DDS

## 2017-10-31 NOTE — Progress Notes (Signed)
POST OPERATIVE NOTE:  10/31/2017 Andre Erickson [id number]  VITALS: BP 127/72 (BP Location: Right Arm)   Pulse 75   Temp 98.4 F (36.9 C)   LABS:  Lab Results  Component Value Date   WBC 12.1 (H) 10/21/2017   HGB 14.3 10/21/2017   HCT 44.5 10/21/2017   MCV 92.7 10/21/2017   PLT 202 10/21/2017   BMET    Component Value Date/Time   NA 137 10/21/2017 0424   K 4.6 10/21/2017 0424   CL 97 (L) 10/21/2017 0424   CO2 30 10/21/2017 0424   GLUCOSE 125 (H) 10/21/2017 0424   BUN 16 10/21/2017 0424   CREATININE 0.94 10/21/2017 0424   CALCIUM 8.4 (L) 10/21/2017 0424   GFRNONAA >60 10/21/2017 0424   GFRAA >60 10/21/2017 0424    No results found for: INR, PROTIME No results found for: PTT   Andre Erickson is status post multiple extractions with alveoloplasty, pre-prosthetic surgery as needed, and gross debridement of remaining dentition in the operating room with general anesthesia.  SUBJECTIVE: Patient with minimal discomfort. Patient uses occasional pain medication as needed. Patient denies any active bleeding.   EXAM: There is no sign of infection, heme, or ooze.  Sutures are loosely intact. Patient has 5 remaining teeth. The patient is healing in by generalized primary closure with several areas healing in by secondary intention.  PROCEDURE: The patient was given a chlorhexidine gluconate rinse for 30 seconds. Sutures were then removed without complication. Patient tolerated the procedure well.  ASSESSMENT: Post operative course is consistent with dental procedures performed in the operating room with general anesthesia. Loss of teeth in the extraction 5 remaining lower teeth  PLAN: 1. Use chlorhexidine rinses twice daily as prescribed. Continue salt water rinses as needed in between the chlorhexidine rinses to aid healing. 2. Brush after meals and at bedtime. Suggest using sensitive toothpaste. 3. Advance diet as tolerated 4. Follow-up with the general  dentist of his choice for fabrication of upper and lower partial dentures after adequate healing Of at least one month. 5. Patient is cleared for heart valve surgery 6. Patient will require antibiotic premedication prior to invasive dental procedures after the heart valve surgery as per American Heart Association guidelines.  Andre Erickson, DDS

## 2017-11-03 ENCOUNTER — Ambulatory Visit: Payer: Medicaid Other | Admitting: Internal Medicine

## 2017-11-03 ENCOUNTER — Encounter

## 2017-11-15 ENCOUNTER — Encounter: Payer: Self-pay | Admitting: Surgery

## 2017-11-15 ENCOUNTER — Ambulatory Visit: Payer: Medicaid Other | Admitting: Surgery

## 2017-11-15 VITALS — BP 96/70 | HR 74 | Resp 20 | Ht 70.0 in | Wt 350.0 lb

## 2017-11-15 DIAGNOSIS — I350 Nonrheumatic aortic (valve) stenosis: Secondary | ICD-10-CM | POA: Diagnosis not present

## 2017-11-15 DIAGNOSIS — Q231 Congenital insufficiency of aortic valve: Secondary | ICD-10-CM | POA: Diagnosis not present

## 2017-11-15 NOTE — Progress Notes (Signed)
HPI:  The patient returns today to review the results of his gated cardiac CTA and CTA of the chest, abdomen, and pelvis which were done for consideration of transcatheter aortic valve replacement for treatment of his critical aortic stenosis.  He continues to have exertional fatigue and shortness of breath.  Current Outpatient Medications  Medication Sig Dispense Refill  . albuterol (PROVENTIL HFA;VENTOLIN HFA) 108 (90 Base) MCG/ACT inhaler Inhale 2 puffs into the lungs every 4 (four) hours as needed for wheezing or shortness of breath.    Marland Kitchen aspirin 81 MG tablet Take 81 mg by mouth daily.     . chlorhexidine (PERIDEX) 0.12 % solution Rinse with 15 mls twice daily for 30 seconds. Use after breakfast and at bedtime. Spit out excess. Do not swallow. 480 mL prn  . Cholecalciferol (VITAMIN D-3) 5000 units TABS Take 5,000 Units by mouth daily.    . diclofenac (VOLTAREN) 75 MG EC tablet Take 75 mg by mouth daily.     . DULoxetine (CYMBALTA) 60 MG capsule Take 60 mg by mouth daily.    . furosemide (LASIX) 40 MG tablet Take 1 tablet (40 mg total) by mouth 2 (two) times daily. 180 tablet 1  . gabapentin (NEURONTIN) 300 MG capsule Take 300 mg by mouth 2 (two) times daily as needed (pain).     . potassium chloride SA (K-DUR,KLOR-CON) 20 MEQ tablet Take 1 tablet (20 mEq total) by mouth daily. 90 tablet 1  . pravastatin (PRAVACHOL) 40 MG tablet Take 40 mg by mouth daily.     No current facility-administered medications for this visit.      Physical Exam: BP 96/70   Pulse 74   Resp 20   Ht 5\' 10"  (1.778 m)   Wt (!) 350 lb (158.8 kg)   SpO2 95% Comment: RA  BMI 50.22 kg/m    Diagnostic Tests:   ADDENDUM REPORT: 10/31/2017 07:46  CLINICAL DATA:  Aortic stenosis  EXAM: Cardiac TAVR CT  TECHNIQUE: The patient was scanned on a Siemens 192 slice scanner. A 120 kV retrospective scan was triggered in the descending thoracic aorta at 111 HU's. Gantry rotation speed was 270 msecs and  collimation was .9 mm. No beta blockade or nitro were given. The 3D data set was reconstructed in 5% intervals of the R-R cycle. Systolic and diastolic phases were analyzed on a dedicated work station using MPR, MIP and VRT modes. The patient received 80 cc of contrast.  FINDINGS: Aortic Valve: Tri leaflet and heavily calcified with restricted leaflet motion  Aorta: Mild root dilatation with mild calcific atherosclerosis and normal arch vessels  Sinotubular Junction: 32 mm  Ascending Thoracic Aorta: 39 mm  Aortic Arch: 28 mm  Descending Thoracic Aorta: 26 mm  Sinus of Valsalva Measurements:  Non-coronary: 35.5 mm  Right -coronary: 32.6 mm  Left -coronary: 34.5 mm  Coronary Artery Height above Annulus:  Left Main: 10.8 mm  Right Coronary: 11.9 mm  Virtual Basal Annulus Measurements:  Maximum/Minimum Diameter: 28.6 mm x 24.7 mm  Perimeter: 87,.8 mm  Area: 571 mm2  Coronary Arteries: Sufficient height above annulus for deployment  Optimum Fluoroscopic Angle for Delivery: LAO 5 Caudal 12 degrees  IMPRESSION: 1. Calcified tri leaflet AV with annular area of 571 mm2 suitable for a 29 mm Sapien 3 valve Large area of annular calcification increases risk of pervalvular regurgitation  2. Optimum angiographic angle for deployment LAO 5 Caudal 12 degrees  3.  No LAA thrombus  4.  Mld aortic root dilatation 3.9 cm  5.  Coronary arteries sufficient height above annulus for deployment  Charlton Haws   Electronically Signed   By: Charlton Haws M.D.   On: 10/31/2017 07:46  CLINICAL DATA:  62 year old male with history of severe aortic stenosis. Preprocedural study prior to potential transcatheter aortic valve replacement (TAVR) procedure.  EXAM: CT ANGIOGRAPHY CHEST, ABDOMEN AND PELVIS  TECHNIQUE: Multidetector CT imaging through the chest, abdomen and pelvis was performed using the standard protocol during bolus administration  of intravenous contrast. Multiplanar reconstructed images and MIPs were obtained and reviewed to evaluate the vascular anatomy.  CONTRAST:  ISOVUE-370 IOPAMIDOL (ISOVUE-370) INJECTION 76%, <See Chart> ISOVUE-370 IOPAMIDOL (ISOVUE-370) INJECTION 76%  COMPARISON:  Chest CT 08/19/2006. No prior CT the abdomen and pelvis.  FINDINGS: CTA CHEST FINDINGS  Cardiovascular: Heart size is mildly enlarged. There is no significant pericardial fluid, thickening or pericardial calcification. There is aortic atherosclerosis, as well as atherosclerosis of the great vessels of the mediastinum and the coronary arteries, including calcified atherosclerotic plaque in the left anterior descending and right coronary arteries. Severe thickening calcification of the aortic valve.  Mediastinum/Lymph Nodes: No pathologically enlarged mediastinal or hilar lymph nodes. Esophagus is unremarkable in appearance. No axillary lymphadenopathy.  Lungs/Pleura: Patchy areas of ground-glass attenuation interspersed with areas of lucency throughout the lungs bilaterally, favored to reflect air trapping from small airways disease. No acute consolidative airspace disease. No pleural effusions. No suspicious appearing pulmonary nodules or masses are noted.  Musculoskeletal/Soft Tissues: There are no aggressive appearing lytic or blastic lesions noted in the visualized portions of the skeleton.  CTA ABDOMEN AND PELVIS FINDINGS  Hepatobiliary: Diffuse low attenuation throughout the hepatic parenchyma, indicative of hepatic steatosis. No suspicious cystic or solid hepatic lesions. No intra or extrahepatic biliary ductal dilatation. Status post cholecystectomy.  Pancreas: No pancreatic mass. No pancreatic ductal dilatation. No pancreatic or peripancreatic fluid or inflammatory changes.  Spleen: Unremarkable.  Adrenals/Urinary Tract: Bilateral kidneys and bilateral adrenal glands are normal in  appearance. No hydroureteronephrosis. Urinary bladder is normal in appearance.  Stomach/Bowel: Normal appearance of the stomach. No pathologic dilatation of small bowel or colon. Extensive colonic diverticulosis, without surrounding inflammatory changes to suggest an acute diverticulitis at this time. The appendix is not confidently identified and may be surgically absent. Regardless, there are no inflammatory changes noted adjacent to the cecum to suggest the presence of an acute appendicitis at this time.  Vascular/Lymphatic: Aortic atherosclerosis, without evidence of aneurysm or dissection in the abdominal or pelvic vasculature. Vascular findings and measurements pertinent to potential TAVR procedure, as detailed below. No lymphadenopathy noted in the abdomen or pelvis.  Reproductive: Prostate gland and seminal vesicles are unremarkable in appearance.  Other: No significant volume of ascites.  No pneumoperitoneum.  Musculoskeletal: There are no aggressive appearing lytic or blastic lesions noted in the visualized portions of the skeleton.  VASCULAR MEASUREMENTS PERTINENT TO TAVR:  AORTA:  Minimal Aortic Diameter-17 x 18 mm  Severity of Aortic Calcification-moderate  RIGHT PELVIS:  Right Common Iliac Artery -  Minimal Diameter-13.3 x 11.3 mm  Tortuosity-mild  Calcification-mild-to-moderate  Right External Iliac Artery -  Minimal Diameter-8.5 x 8.3 mm  Tortuosity-severe  Calcification-none  Right Common Femoral Artery -  Minimal Diameter-8.7 x 8.4 mm  Tortuosity-mild  Calcification-mild  LEFT PELVIS:  Left Common Iliac Artery -  Minimal Diameter-10.9 x 12.1 mm  Tortuosity-mild  Calcification-mild-to-moderate  Left External Iliac Artery -  Minimal Diameter-8.5 x 8.2 mm  Tortuosity-severe  Calcification-none  Left Common Femoral Artery -  Minimal Diameter-9.5 x 8.9  mm  Tortuosity-mild  Calcification-mild  Review of the MIP images confirms the above findings.  IMPRESSION: 1. Vascular findings and measurements pertinent to potential TAVR procedure, as detailed above. 2. Severe thickening and calcification of the aortic valve, compatible with the reported clinical history of severe aortic stenosis. 3. Aortic atherosclerosis, in addition to 2 vessel coronary artery disease. Please note that although the presence of coronary artery calcium documents the presence of coronary artery disease, the severity of this disease and any potential stenosis cannot be assessed on this non-gated CT examination. Assessment for potential risk factor modification, dietary therapy or pharmacologic therapy may be warranted, if clinically indicated. 4. Severe colonic diverticulosis without evidence of acute diverticulitis at this time. 5. Additional incidental findings, as above.  Aortic Atherosclerosis (ICD10-I70.0).   Electronically Signed   By: Trudie Reed M.D.   On: 10/26/2017 16:21   Impression:  I have personally reviewed his gated cardiac CTA as well as the CTA of his chest, abdomen, and pelvis.  His gated cardiac CTA shows anatomy suitable for transcatheter aortic valve replacement using a 29 mm Sapien 3 valve.  There is an area of annular calcification which may increase the risk of a perivalvular leak.  The abdominal and pelvic CTA shows adequate pelvic vascular anatomy to allow transfemoral insertion.  I reviewed these results with the patient and his wife.  We reviewed the alternatives of transcatheter aortic valve replacement versus surgical aortic valve replacement.  His STS risk of mortality is 7% and there is a 45% risk of prolonged ventilation due to his super morbid obesity with moderate obstructive and restrictive lung disease.  I think his operative risk is certainly significantly higher with open surgical aortic valve replacement then  TAVR and he would have a much slower recovery.  He is only 62 years old and we do not have good longevity data on transcatheter aortic valves which I think is weighing heavily on his mind.  He does size to a 29 mm valve which I would expect to have a better longevity and would allow valve in valve replacement in the future if necessary.  I discussed all this with him.  I think he would be a candidate for either procedure and really just depends on what he is most comfortable with.  He would like to think about this further and I asked him to contact us when he makes a decision.  I told him that I would be happy to have him come back to the office to talk to me further or would call him back to answer any further questions.  Plan:  The patient will think about what treatment he would like to pursue and will contact us once he has made a decision.   I spent 10 minutes performing this established patient evaluation and > 50% of this time was spent face to face counseling and coordinating the care of this patient's critical aortic stenosis.  Alleen Borne, MD [facility] [phone]

## 2017-11-28 ENCOUNTER — Telehealth: Payer: Self-pay

## 2017-11-28 NOTE — Telephone Encounter (Signed)
I spoke with the pt's wife to see if there were any additional questions that the pt had at this time.  No questions at this time per wife.  The pt is still considering his options and doing research.  I did make her aware that Dr Laneta Simmers will be in the OR performing TAVR on 12/27/17 if the pt decides to proceed this route. She will contact the office once the pt makes a decision.

## 2017-12-20 NOTE — Progress Notes (Signed)
Pt [facility]R vs SAVR.  Documentation reviewed in Care Everywhere and they plan to perform SAVR.  I will remove this pt from our active TAVR pt list.

## 2018-01-06 ENCOUNTER — Ambulatory Visit: Payer: Self-pay | Admitting: Cardiovascular Disease

## 2018-02-13 DIAGNOSIS — Z952 Presence of prosthetic heart valve: Secondary | ICD-10-CM | POA: Insufficient documentation

## 2018-02-20 MED ORDER — ONDANSETRON HCL 4 MG/2ML IJ SOLN
4.00 | INTRAMUSCULAR | Status: DC
Start: ? — End: 2018-02-20

## 2018-02-20 MED ORDER — PRAVASTATIN SODIUM 20 MG PO TABS
40.00 | ORAL_TABLET | ORAL | Status: DC
Start: 2018-02-21 — End: 2018-02-20

## 2018-02-20 MED ORDER — SENNOSIDES-DOCUSATE SODIUM 8.6-50 MG PO TABS
2.00 | ORAL_TABLET | ORAL | Status: DC
Start: 2018-02-20 — End: 2018-02-20

## 2018-02-20 MED ORDER — ACETAMINOPHEN 325 MG PO TABS
650.00 | ORAL_TABLET | ORAL | Status: DC
Start: 2018-02-20 — End: 2018-02-20

## 2018-02-20 MED ORDER — ALBUTEROL SULFATE HFA 108 (90 BASE) MCG/ACT IN AERS
2.00 | INHALATION_SPRAY | RESPIRATORY_TRACT | Status: DC
Start: ? — End: 2018-02-20

## 2018-02-20 MED ORDER — LIDOCAINE 5 % EX PTCH
2.00 | MEDICATED_PATCH | CUTANEOUS | Status: DC
Start: 2018-02-20 — End: 2018-02-20

## 2018-02-20 MED ORDER — POTASSIUM CHLORIDE ER 20 MEQ PO TBCR
40.00 | EXTENDED_RELEASE_TABLET | ORAL | Status: DC
Start: 2018-02-20 — End: 2018-02-20

## 2018-02-20 MED ORDER — HYDROXYZINE HCL 10 MG PO TABS
10.00 | ORAL_TABLET | ORAL | Status: DC
Start: ? — End: 2018-02-20

## 2018-02-20 MED ORDER — RANITIDINE HCL 150 MG PO TABS
150.00 | ORAL_TABLET | ORAL | Status: DC
Start: 2018-02-20 — End: 2018-02-20

## 2018-02-20 MED ORDER — DEXTROSE 50 % IV SOLN
12.50 | INTRAVENOUS | Status: DC
Start: ? — End: 2018-02-20

## 2018-02-20 MED ORDER — METOLAZONE 2.5 MG PO TABS
5.00 | ORAL_TABLET | ORAL | Status: DC
Start: 2018-02-21 — End: 2018-02-20

## 2018-02-20 MED ORDER — IPRATROPIUM-ALBUTEROL 0.5-2.5 (3) MG/3ML IN SOLN
3.00 | RESPIRATORY_TRACT | Status: DC
Start: ? — End: 2018-02-20

## 2018-02-20 MED ORDER — POLYETHYLENE GLYCOL 3350 17 G PO PACK
17.00 | PACK | ORAL | Status: DC
Start: 2018-02-21 — End: 2018-02-20

## 2018-02-20 MED ORDER — GLUCAGON HCL RDNA (DIAGNOSTIC) 1 MG IJ SOLR
1.00 | INTRAMUSCULAR | Status: DC
Start: ? — End: 2018-02-20

## 2018-02-20 MED ORDER — OXYCODONE HCL 5 MG PO TABS
5.00 | ORAL_TABLET | ORAL | Status: DC
Start: ? — End: 2018-02-20

## 2018-02-20 MED ORDER — MAGNESIUM OXIDE 400 MG PO TABS
800.00 | ORAL_TABLET | ORAL | Status: DC
Start: 2018-02-20 — End: 2018-02-20

## 2018-02-20 MED ORDER — ASPIRIN 81 MG PO CHEW
81.00 | CHEWABLE_TABLET | ORAL | Status: DC
Start: 2018-02-21 — End: 2018-02-20

## 2018-02-20 MED ORDER — GENERIC EXTERNAL MEDICATION
60.00 | Status: DC
Start: 2018-02-20 — End: 2018-02-20

## 2018-03-17 ENCOUNTER — Telehealth: Payer: Self-pay | Admitting: *Deleted

## 2018-03-17 NOTE — Telephone Encounter (Signed)
Heart Track clearance has been successfully faxed to [phone]

## 2018-03-20 ENCOUNTER — Encounter: Payer: Self-pay | Admitting: *Deleted

## 2018-03-20 ENCOUNTER — Encounter: Payer: Medicaid Other | Attending: Vascular Surgery | Admitting: *Deleted

## 2018-03-20 VITALS — Ht 68.9 in | Wt 334.9 lb

## 2018-03-20 DIAGNOSIS — Z952 Presence of prosthetic heart valve: Secondary | ICD-10-CM | POA: Insufficient documentation

## 2018-03-20 NOTE — Progress Notes (Signed)
Cardiac Individual Treatment Plan  Patient Details  Name: Andre Erickson MRN: [id number] Date of Birth: 1955/01/21 Referring Provider:     Cardiac Rehab from 03/20/2018 in [facility] Cardiac and Pulmonary Rehab  Referring Provider  Shade Flood MD      Initial Encounter Date:    Cardiac Rehab from 03/20/2018 in [facility] Cardiac and Pulmonary Rehab  Date  03/20/18      Visit Diagnosis: S/P AVR (aortic valve replacement)  Patient's Home Medications on Admission:  Current Outpatient Medications:  .  albuterol (PROVENTIL HFA;VENTOLIN HFA) 108 (90 Base) MCG/ACT inhaler, Inhale 2 puffs into the lungs every 4 (four) hours as needed for wheezing or shortness of breath., Disp: , Rfl:  .  aspirin 81 MG tablet, Take 81 mg by mouth daily. , Disp: , Rfl:  .  DULoxetine (CYMBALTA) 60 MG capsule, Take 60 mg by mouth daily., Disp: , Rfl:  .  furosemide (LASIX) 40 MG tablet, Take 1 tablet (40 mg total) by mouth 2 (two) times daily., Disp: 180 tablet, Rfl: 1 .  gabapentin (NEURONTIN) 300 MG capsule, Take 300 mg by mouth 2 (two) times daily as needed (pain). , Disp: , Rfl:  .  Ipratropium-Albuterol (COMBIVENT) 20-100 MCG/ACT AERS respimat, Inhale into the lungs., Disp: , Rfl:  .  potassium chloride SA (K-DUR,KLOR-CON) 20 MEQ tablet, Take 1 tablet (20 mEq total) by mouth daily., Disp: 90 tablet, Rfl: 1 .  pravastatin (PRAVACHOL) 40 MG tablet, Take 40 mg by mouth daily., Disp: , Rfl:  .  chlorhexidine (PERIDEX) 0.12 % solution, Rinse with 15 mls twice daily for 30 seconds. Use after breakfast and at bedtime. Spit out excess. Do not swallow. (Patient not taking: Reported on 03/20/2018), Disp: 480 mL, Rfl: prn .  Cholecalciferol (VITAMIN D-3) 5000 units TABS, Take 5,000 Units by mouth daily., Disp: , Rfl:  .  diclofenac (VOLTAREN) 75 MG EC tablet, Take 75 mg by mouth daily. , Disp: , Rfl:   Past Medical History: Past Medical History:  Diagnosis Date  . Aortic valve stenosis   . Arthritis   . Asthma    as a  child  . GERD (gastroesophageal reflux disease)   . Hyperlipidemia   . Hypertension   . Morbid obesity ([place])   . Murmur   . Sleep apnea    trying to use the CPAP    Tobacco Use: Social History   Tobacco Use  Smoking Status Former Smoker  . Last attempt to quit: 09/27/1978  . Years since quitting: 39.5  Smokeless Tobacco Never Used    Labs: Recent Review Flowsheet Data    There is no flowsheet data to display.       Exercise Target Goals: Exercise Program Goal: Individual exercise prescription set using results from initial 6 min walk test and THRR while considering  patient's activity barriers and safety.   Exercise Prescription Goal: Initial exercise prescription builds to 30-45 minutes a day of aerobic activity, 2-3 days per week.  Home exercise guidelines will be given to patient during program as part of exercise prescription that the participant will acknowledge.  Activity Barriers & Risk Stratification: Activity Barriers & Cardiac Risk Stratification - 03/20/18 1423      Activity Barriers & Cardiac Risk Stratification   Activity Barriers  Deconditioning;Muscular Weakness;Shortness of Breath;Balance Concerns;Assistive Device;Decreased Ventricular Function;Joint Problems;Arthritis   R knee pain, Arth in knees and shoulders, R Frozen Shoulder (limited ROM)   Cardiac Risk Stratification  High       6  Minute Walk: 6 Minute Walk    Row Name 03/20/18 1421         6 Minute Walk   Phase  Initial     Distance  933 feet walks with cane, limps due to R knee     Walk Time  6 minutes     # of Rest Breaks  0     MPH  1.77     METS  2.34     RPE  11     VO2 Peak  8.17     Symptoms  No     Resting HR  84 bpm     Resting BP  132/70     Resting Oxygen Saturation   97 %     Exercise Oxygen Saturation  during 6 min walk  89 %     Max Ex. HR  84 bpm     Max Ex. BP  132/70     2 Minute Post BP  142/72        Oxygen Initial Assessment:   Oxygen  Re-Evaluation:   Oxygen Discharge (Final Oxygen Re-Evaluation):   Initial Exercise Prescription: Initial Exercise Prescription - 03/20/18 1400      Date of Initial Exercise RX and Referring Provider   Date  03/20/18    Referring Provider  Shade Flood MD      Recumbant Elliptical   Level  1    RPM  50    Minutes  15    METs  2.3      T5 Nustep   Level  1    SPM  80    Minutes  15    METs  2.3      Track   Laps  27    Minutes  15    METs  2.23      Prescription Details   Frequency (times per week)  3    Duration  Progress to 45 minutes of aerobic exercise without signs/symptoms of physical distress      Intensity   THRR 40-80% of Max Heartrate  114-144    Ratings of Perceived Exertion  11-13    Perceived Dyspnea  0-4      Progression   Progression  Continue to progress workloads to maintain intensity without signs/symptoms of physical distress.      Resistance Training   Training Prescription  Yes    Weight  3 lbs    Reps  10-15       Perform Capillary Blood Glucose checks as needed.  Exercise Prescription Changes: Exercise Prescription Changes    Row Name 03/20/18 1400             Response to Exercise   Blood Pressure (Admit)  132/70       Blood Pressure (Exercise)  192/82       Blood Pressure (Exit)  142/72       Heart Rate (Admit)  84 bpm       Heart Rate (Exercise)  132 bpm       Heart Rate (Exit)  94 bpm       Oxygen Saturation (Admit)  97 %       Oxygen Saturation (Exercise)  89 %       Oxygen Saturation (Exit)  95 %       Rating of Perceived Exertion (Exercise)  11       Symptoms  walking with cane and limp due to R knee  Comments  walk test results          Exercise Comments:   Exercise Goals and Review: Exercise Goals    Row Name 03/20/18 1433             Exercise Goals   Increase Physical Activity  Yes       Intervention  Provide advice, education, support and counseling about physical activity/exercise  needs.;Develop an individualized exercise prescription for aerobic and resistive training based on initial evaluation findings, risk stratification, comorbidities and participant's personal goals.       Expected Outcomes  Short Term: Attend rehab on a regular basis to increase amount of physical activity.;Long Term: Add in home exercise to make exercise part of routine and to increase amount of physical activity.;Long Term: Exercising regularly at least 3-5 days a week.       Increase Strength and Stamina  Yes       Intervention  Provide advice, education, support and counseling about physical activity/exercise needs.;Develop an individualized exercise prescription for aerobic and resistive training based on initial evaluation findings, risk stratification, comorbidities and participant's personal goals.       Expected Outcomes  Long Term: Improve cardiorespiratory fitness, muscular endurance and strength as measured by increased METs and functional capacity (6MWT);Short Term: Perform resistance training exercises routinely during rehab and add in resistance training at home;Short Term: Increase workloads from initial exercise prescription for resistance, speed, and METs.       Able to understand and use rate of perceived exertion (RPE) scale  Yes       Intervention  Provide education and explanation on how to use RPE scale       Expected Outcomes  Short Term: Able to use RPE daily in rehab to express subjective intensity level;Long Term:  Able to use RPE to guide intensity level when exercising independently       Knowledge and understanding of Target Heart Rate Range (THRR)  Yes       Intervention  Provide education and explanation of THRR including how the numbers were predicted and where they are located for reference       Expected Outcomes  Short Term: Able to state/look up THRR;Long Term: Able to use THRR to govern intensity when exercising independently;Short Term: Able to use daily as guideline  for intensity in rehab       Able to check pulse independently  Yes       Intervention  Provide education and demonstration on how to check pulse in carotid and radial arteries.;Review the importance of being able to check your own pulse for safety during independent exercise       Expected Outcomes  Short Term: Able to explain why pulse checking is important during independent exercise;Long Term: Able to check pulse independently and accurately       Understanding of Exercise Prescription  Yes       Intervention  Provide education, explanation, and written materials on patient's individual exercise prescription       Expected Outcomes  Short Term: Able to explain program exercise prescription;Long Term: Able to explain home exercise prescription to exercise independently          Exercise Goals Re-Evaluation :   Discharge Exercise Prescription (Final Exercise Prescription Changes): Exercise Prescription Changes - 03/20/18 1400      Response to Exercise   Blood Pressure (Admit)  132/70    Blood Pressure (Exercise)  192/82    Blood Pressure (Exit)  142/72  Heart Rate (Admit)  84 bpm    Heart Rate (Exercise)  132 bpm    Heart Rate (Exit)  94 bpm    Oxygen Saturation (Admit)  97 %    Oxygen Saturation (Exercise)  89 %    Oxygen Saturation (Exit)  95 %    Rating of Perceived Exertion (Exercise)  11    Symptoms  walking with cane and limp due to R knee    Comments  walk test results       Nutrition:  Target Goals: Understanding of nutrition guidelines, daily intake of sodium '1500mg'$ , cholesterol '200mg'$ , calories 30% from fat and 7% or less from saturated fats, daily to have 5 or more servings of fruits and vegetables.  Biometrics: Pre Biometrics - 03/20/18 1434      Pre Biometrics   Height  5' 8.9" (1.75 m)    Weight  (!) 334 lb 14.4 oz (151.9 kg)    Waist Circumference  52 inches    Hip Circumference  58 inches    Waist to Hip Ratio  0.9 %    BMI (Calculated)  49.6     Single Leg Stand  334.9 seconds        Nutrition Therapy Plan and Nutrition Goals: Nutrition Therapy & Goals - 03/20/18 1420      Intervention Plan   Intervention  Prescribe, educate and counsel regarding individualized specific dietary modifications aiming towards targeted core components such as weight, hypertension, lipid management, diabetes, heart failure and other comorbidities.;Nutrition handout(s) given to patient.    Expected Outcomes  Short Term Goal: Understand basic principles of dietary content, such as calories, fat, sodium, cholesterol and nutrients.;Short Term Goal: A plan has been developed with personal nutrition goals set during dietitian appointment.;Long Term Goal: Adherence to prescribed nutrition plan.       Nutrition Assessments: Nutrition Assessments - 03/20/18 1420      MEDFICTS Scores   Pre Score  41       Nutrition Goals Re-Evaluation:   Nutrition Goals Discharge (Final Nutrition Goals Re-Evaluation):   Psychosocial: Target Goals: Acknowledge presence or absence of significant depression and/or stress, maximize coping skills, provide positive support system. Participant is able to verbalize types and ability to use techniques and skills needed for reducing stress and depression.   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 His incision is currently still healing, and his MD said no caring for the animals until it is completely healed. 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 He does notice a huge improvement in his SOB, energy, and sleep habits since getting his valve replacement      Family  Dynamics   Good Support System?  Yes   wife     Barriers   Psychosocial barriers to participate in program  There are no identifiable barriers or psychosocial needs.;The patient should benefit from training in stress management and relaxation.      Screening Interventions   Interventions  Encouraged to exercise;Program counselor consult;Provide feedback about the scores to participant;To provide support and resources with identified psychosocial needs    Expected Outcomes  Short Term goal: Utilizing  psychosocial counselor, staff and physician to assist with identification of specific Stressors or current issues interfering with healing process. Setting desired goal for each stressor or current issue identified.;Long Term Goal: Stressors or current issues are controlled or eliminated.;Short Term goal: Identification and review with participant of any Quality of Life or Depression concerns found by scoring the questionnaire.;Long Term goal: The participant improves quality of Life and PHQ9 Scores as seen by post scores and/or verbalization of changes       Quality of Life Scores:  Quality of Life - 03/20/18 1420      Quality of Life   Select  Quality of Life      Quality of Life Scores   Health/Function Pre  21.17 %    Socioeconomic Pre  22.92 %    Psych/Spiritual Pre  23.93 %    Family Pre  22.8 %    GLOBAL Pre  22.32 %      Scores of 19 and below usually indicate a poorer quality of life in these areas.  A difference of  2-3 points is a clinically meaningful difference.  A difference of 2-3 points in the total score of the Quality of Life Index has been associated with significant improvement in overall quality of life, self-image, physical symptoms, and general health in studies assessing change in quality of life.  PHQ-9: Recent Review Flowsheet Data    Depression screen [facility] 2/9 03/20/2018   Decreased Interest 1   Down, Depressed, Hopeless 1   PHQ - 2 Score 2   Altered sleeping 0    Tired, decreased energy 1   Change in appetite 0   Feeling bad or failure about yourself  0   Trouble concentrating 0   Moving slowly or fidgety/restless 0   Suicidal thoughts 0   PHQ-9 Score 3   Difficult doing work/chores Somewhat difficult     Interpretation of Total Score  Total Score Depression Severity:  1-4 = Minimal depression, 5-9 = Mild depression, 10-14 = Moderate depression, 15-19 = Moderately severe depression, 20-27 = Severe depression   Psychosocial Evaluation and Intervention:   Psychosocial Re-Evaluation:   Psychosocial Discharge (Final Psychosocial Re-Evaluation):   Vocational Rehabilitation: Provide vocational rehab assistance to qualifying candidates.   Vocational Rehab Evaluation & Intervention: Vocational Rehab - 03/20/18 1415      Initial Vocational Rehab Evaluation & Intervention   Assessment shows need for Vocational Rehabilitation  No       Education: Education Goals: Education classes will be provided on a variety of topics geared toward better understanding of heart health and risk factor modification. Participant will state understanding/return demonstration of topics presented as noted by education test scores.  Learning Barriers/Preferences: Learning Barriers/Preferences - 03/20/18 1412      Learning Barriers/Preferences   Learning Barriers  Exercise Concerns    Learning Preferences  Group Instruction;Individual Instruction;Verbal Instruction;Skilled Demonstration;Written Material;Video       Education Topics:  AED/CPR: - Group verbal and written instruction with the use of models to demonstrate the basic use of the AED with the basic ABC's of resuscitation.   General Nutrition Guidelines/Fats and Fiber: -Group instruction provided by verbal, written material, models and posters to present the general guidelines for heart healthy nutrition. Gives an explanation and review of dietary fats and fiber.   Controlling Sodium/Reading  Food Labels: -Group verbal and written material supporting the discussion of sodium use in heart healthy nutrition. Review and explanation with models, verbal and written materials for utilization of  the food label.   Exercise Physiology & General Exercise Guidelines: - Group verbal and written instruction with models to review the exercise physiology of the cardiovascular system and associated critical values. Provides general exercise guidelines with specific guidelines to those with heart or lung disease.    Aerobic Exercise & Resistance Training: - Gives group verbal and written instruction on the various components of exercise. Focuses on aerobic and resistive training programs and the benefits of this training and how to safely progress through these programs..   Flexibility, Balance, Mind/Body Relaxation: Provides group verbal/written instruction on the benefits of flexibility and balance training, including mind/body exercise modes such as yoga, pilates and tai chi.  Demonstration and skill practice provided.   Stress and Anxiety: - Provides group verbal and written instruction about the health risks of elevated stress and causes of high stress.  Discuss the correlation between heart/lung disease and anxiety and treatment options. Review healthy ways to manage with stress and anxiety.   Depression: - Provides group verbal and written instruction on the correlation between heart/lung disease and depressed mood, treatment options, and the stigmas associated with seeking treatment.   Anatomy & Physiology of the Heart: - Group verbal and written instruction and models provide basic cardiac anatomy and physiology, with the coronary electrical and arterial systems. Review of Valvular disease and Heart Failure   Cardiac Procedures: - Group verbal and written instruction to review commonly prescribed medications for heart disease. Reviews the medication, class of the drug, and side  effects. Includes the steps to properly store meds and maintain the prescription regimen. (beta blockers and nitrates)   Cardiac Medications I: - Group verbal and written instruction to review commonly prescribed medications for heart disease. Reviews the medication, class of the drug, and side effects. Includes the steps to properly store meds and maintain the prescription regimen.   Cardiac Medications II: -Group verbal and written instruction to review commonly prescribed medications for heart disease. Reviews the medication, class of the drug, and side effects. (all other drug classes)    Go Sex-Intimacy & Heart Disease, Get SMART - Goal Setting: - Group verbal and written instruction through game format to discuss heart disease and the return to sexual intimacy. Provides group verbal and written material to discuss and apply goal setting through the application of the S.M.A.R.T. Method.   Other Matters of the Heart: - Provides group verbal, written materials and models to describe Stable Angina and Peripheral Artery. Includes description of the disease process and treatment options available to the cardiac patient.   Exercise & Equipment Safety: - Individual verbal instruction and demonstration of equipment use and safety with use of the equipment.   Cardiac Rehab from 03/20/2018 in [facility] Cardiac and Pulmonary Rehab  Date  03/20/18  Educator  [facility]  Instruction Review Code  1- Verbalizes Understanding      Infection Prevention: - Provides verbal and written material to individual with discussion of infection control including proper hand washing and proper equipment cleaning during exercise session.   Cardiac Rehab from 03/20/2018 in [facility] Cardiac and Pulmonary Rehab  Date  03/20/18  Educator  [facility]  Instruction Review Code  1- Verbalizes Understanding      Falls Prevention: - Provides verbal and written material to individual with discussion of falls prevention and safety.   Cardiac  Rehab from 03/20/2018 in [facility] Cardiac and Pulmonary Rehab  Date  03/20/18  Educator  [facility]  Instruction Review Code  1- Verbalizes Understanding  Diabetes: - Individual verbal and written instruction to review signs/symptoms of diabetes, desired ranges of glucose level fasting, after meals and with exercise. Acknowledge that pre and post exercise glucose checks will be done for 3 sessions at entry of program.   Know Your Numbers and Risk Factors: -Group verbal and written instruction about important numbers in your health.  Discussion of what are risk factors and how they play a role in the disease process.  Review of Cholesterol, Blood Pressure, Diabetes, and BMI and the role they play in your overall health.   Sleep Hygiene: -Provides group verbal and written instruction about how sleep can affect your health.  Define sleep hygiene, discuss sleep cycles and impact of sleep habits. Review good sleep hygiene tips.    Other: -Provides group and verbal instruction on various topics (see comments)   Knowledge Questionnaire Score: Knowledge Questionnaire Score - 03/20/18 1414      Knowledge Questionnaire Score   Pre Score  23/26   correct answers reviewed with Lynann Bologna. Focus on Angina      Core Components/Risk Factors/Patient Goals at Admission: Personal Goals and Risk Factors at Admission - 03/20/18 1411      Core Components/Risk Factors/Patient Goals on Admission    Weight Management  Yes;Obesity;Weight Loss    Intervention  Weight Management: Develop a combined nutrition and exercise program designed to reach desired caloric intake, while maintaining appropriate intake of nutrient and fiber, sodium and fats, and appropriate energy expenditure required for the weight goal.;Weight Management: Provide education and appropriate resources to help participant work on and attain dietary goals.;Weight Management/Obesity: Establish reasonable short term and long term weight goals.;Obesity:  Provide education and appropriate resources to help participant work on and attain dietary goals.    Admit Weight  334 lb 12.8 oz (151.9 kg)    Goal Weight: Short Term  330 lb (149.7 kg)    Goal Weight: Long Term  250 lb (113.4 kg)    Expected Outcomes  Short Term: Continue to assess and modify interventions until short term weight is achieved;Long Term: Adherence to nutrition and physical activity/exercise program aimed toward attainment of established weight goal;Weight Loss: Understanding of general recommendations for a balanced deficit meal plan, which promotes 1-2 lb weight loss per week and includes a negative energy balance of [phone] kcal/d;Understanding recommendations for meals to include 15-35% energy as protein, 25-35% energy from fat, 35-60% energy from carbohydrates, less than '200mg'$  of dietary cholesterol, 20-35 gm of total fiber daily;Understanding of distribution of calorie intake throughout the day with the consumption of 4-5 meals/snacks    Heart Failure  Yes    Intervention  Provide a combined exercise and nutrition program that is supplemented with education, support and counseling about heart failure. Directed toward relieving symptoms such as shortness of breath, decreased exercise tolerance, and extremity edema.    Expected Outcomes  Improve functional capacity of life;Short term: Daily weights obtained and reported for increase. Utilizing diuretic protocols set by physician.;Short term: Attendance in program 2-3 days a week with increased exercise capacity. Reported lower sodium intake. Reported increased fruit and vegetable intake. Reports medication compliance.;Long term: Adoption of self-care skills and reduction of barriers for early signs and symptoms recognition and intervention leading to self-care maintenance.    Hypertension  Yes    Intervention  Provide education on lifestyle modifcations including regular physical activity/exercise, weight management, moderate sodium  restriction and increased consumption of fresh fruit, vegetables, and low fat dairy, alcohol moderation, and smoking cessation.;Monitor prescription use  compliance.    Expected Outcomes  Short Term: Continued assessment and intervention until BP is < 140/67m HG in hypertensive participants. < 130/862mHG in hypertensive participants with diabetes, heart failure or chronic kidney disease.;Long Term: Maintenance of blood pressure at goal levels.    Lipids  Yes    Intervention  Provide education and support for participant on nutrition & aerobic/resistive exercise along with prescribed medications to achieve LDL '70mg'$ , HDL >'40mg'$ .    Expected Outcomes  Short Term: Participant states understanding of desired cholesterol values and is compliant with medications prescribed. Participant is following exercise prescription and nutrition guidelines.;Long Term: Cholesterol controlled with medications as prescribed, with individualized exercise RX and with personalized nutrition plan. Value goals: LDL < '70mg'$ , HDL > 40 mg.       Core Components/Risk Factors/Patient Goals Review:    Core Components/Risk Factors/Patient Goals at Discharge (Final Review):    ITP Comments: ITP Comments    Row Name 03/20/18 1337           ITP Comments  Med Review completed. Initial ITP created. Diagnosis can be found in CE 2/17          Comments: Initial ITP

## 2018-03-20 NOTE — Patient Instructions (Signed)
Patient Instructions  Patient Details  Name: Andre Erickson MRN: [id number] Date of Birth: [birth year] Referring Provider:  Oris Drone, MD  Below are your personal goals for exercise, nutrition, and risk factors. Our goal is to help you stay on track towards obtaining and maintaining these goals. We will be discussing your progress on these goals with you throughout the program.  Initial Exercise Prescription: Initial Exercise Prescription - 03/20/18 1400      Date of Initial Exercise RX and Referring Provider   Date  03/20/18    Referring Provider  Oris Drone MD      Recumbant Elliptical   Level  1    RPM  50    Minutes  15    METs  2.3      T5 Nustep   Level  1    SPM  80    Minutes  15    METs  2.3      Track   Laps  27    Minutes  15    METs  2.23      Prescription Details   Frequency (times per week)  3    Duration  Progress to 45 minutes of aerobic exercise without signs/symptoms of physical distress      Intensity   THRR 40-80% of Max Heartrate  114-144    Ratings of Perceived Exertion  11-13    Perceived Dyspnea  0-4      Progression   Progression  Continue to progress workloads to maintain intensity without signs/symptoms of physical distress.      Resistance Training   Training Prescription  Yes    Weight  3 lbs    Reps  10-15       Exercise Goals: Frequency: Be able to perform aerobic exercise two to three times per week in program working toward 2-5 days per week of home exercise.  Intensity: Work with a perceived exertion of 11 (fairly light) - 15 (hard) while following your exercise prescription.  We will make changes to your prescription with you as you progress through the program.   Duration: Be able to do 30 to 45 minutes of continuous aerobic exercise in addition to a 5 minute warm-up and a 5 minute cool-down routine.   Nutrition Goals: Your personal nutrition goals will be established when you do your nutrition analysis with the  dietician.  The following are general nutrition guidelines to follow: Cholesterol < 200mg /day Sodium < 1500mg /day Fiber: Men over 50 yrs - 30 grams per day  Personal Goals: Personal Goals and Risk Factors at Admission - 03/20/18 1411      Core Components/Risk Factors/Patient Goals on Admission    Weight Management  Yes;Obesity;Weight Loss    Intervention  Weight Management: Develop a combined nutrition and exercise program designed to reach desired caloric intake, while maintaining appropriate intake of nutrient and fiber, sodium and fats, and appropriate energy expenditure required for the weight goal.;Weight Management: Provide education and appropriate resources to help participant work on and attain dietary goals.;Weight Management/Obesity: Establish reasonable short term and long term weight goals.;Obesity: Provide education and appropriate resources to help participant work on and attain dietary goals.    Admit Weight  334 lb 12.8 oz (151.9 kg)    Goal Weight: Short Term  330 lb (149.7 kg)    Goal Weight: Long Term  250 lb (113.4 kg)    Expected Outcomes  Short Term: Continue to assess and modify interventions until short term weight  is achieved;Long Term: Adherence to nutrition and physical activity/exercise program aimed toward attainment of established weight goal;Weight Loss: Understanding of general recommendations for a balanced deficit meal plan, which promotes 1-2 lb weight loss per week and includes a negative energy balance of [phone] kcal/d;Understanding recommendations for meals to include 15-35% energy as protein, 25-35% energy from fat, 35-60% energy from carbohydrates, less than  of dietary cholesterol, 20-35 gm of total fiber daily;Understanding of distribution of calorie intake throughout the day with the consumption of 4-5 meals/snacks    Heart Failure  Yes    Intervention  Provide a combined exercise and nutrition program that is supplemented with education, support  and counseling about heart failure. Directed toward relieving symptoms such as shortness of breath, decreased exercise tolerance, and extremity edema.    Expected Outcomes  Improve functional capacity of life;Short term: Daily weights obtained and reported for increase. Utilizing diuretic protocols set by physician.;Short term: Attendance in program 2-3 days a week with increased exercise capacity. Reported lower sodium intake. Reported increased fruit and vegetable intake. Reports medication compliance.;Long term: Adoption of self-care skills and reduction of barriers for early signs and symptoms recognition and intervention leading to self-care maintenance.    Hypertension  Yes    Intervention  Provide education on lifestyle modifcations including regular physical activity/exercise, weight management, moderate sodium restriction and increased consumption of fresh fruit, vegetables, and low fat dairy, alcohol moderation, and smoking cessation.;Monitor prescription use compliance.    Expected Outcomes  Short Term: Continued assessment and intervention until BP is < 140/39mm HG in hypertensive participants. < 130/35mm HG in hypertensive participants with diabetes, heart failure or chronic kidney disease.;Long Term: Maintenance of blood pressure at goal levels.    Lipids  Yes    Intervention  Provide education and support for participant on nutrition & aerobic/resistive exercise along with prescribed medications to achieve LDL 70mg , HDL >40mg .    Expected Outcomes  Short Term: Participant states understanding of desired cholesterol values and is compliant with medications prescribed. Participant is following exercise prescription and nutrition guidelines.;Long Term: Cholesterol controlled with medications as prescribed, with individualized exercise RX and with personalized nutrition plan. Value goals: LDL < , HDL > 40 mg.       Tobacco Use Initial Evaluation: Social History   Tobacco Use  Smoking  Status Former Smoker  . Last attempt to quit: 09/27/1978  . Years since quitting: 39.5  Smokeless Tobacco Never Used    Exercise Goals and Review: Exercise Goals    Row Name 03/20/18 1433             Exercise Goals   Increase Physical Activity  Yes       Intervention  Provide advice, education, support and counseling about physical activity/exercise needs.;Develop an individualized exercise prescription for aerobic and resistive training based on initial evaluation findings, risk stratification, comorbidities and participant's personal goals.       Expected Outcomes  Short Term: Attend rehab on a regular basis to increase amount of physical activity.;Long Term: Add in home exercise to make exercise part of routine and to increase amount of physical activity.;Long Term: Exercising regularly at least 3-5 days a week.       Increase Strength and Stamina  Yes       Intervention  Provide advice, education, support and counseling about physical activity/exercise needs.;Develop an individualized exercise prescription for aerobic and resistive training based on initial evaluation findings, risk stratification, comorbidities and participant's personal goals.  Expected Outcomes  Long Term: Improve cardiorespiratory fitness, muscular endurance and strength as measured by increased METs and functional capacity ( );Short Term: Perform resistance training exercises routinely during rehab and add in resistance training at home;Short Term: Increase workloads from initial exercise prescription for resistance, speed, and METs.       Able to understand and use rate of perceived exertion (RPE) scale  Yes       Intervention  Provide education and explanation on how to use RPE scale       Expected Outcomes  Short Term: Able to use RPE daily in rehab to express subjective intensity level;Long Term:  Able to use RPE to guide intensity level when exercising independently       Knowledge and understanding of  Target Heart Rate Range (THRR)  Yes       Intervention  Provide education and explanation of THRR including how the numbers were predicted and where they are located for reference       Expected Outcomes  Short Term: Able to state/look up THRR;Long Term: Able to use THRR to govern intensity when exercising independently;Short Term: Able to use daily as guideline for intensity in rehab       Able to check pulse independently  Yes       Intervention  Provide education and demonstration on how to check pulse in carotid and radial arteries.;Review the importance of being able to check your own pulse for safety during independent exercise       Expected Outcomes  Short Term: Able to explain why pulse checking is important during independent exercise;Long Term: Able to check pulse independently and accurately       Understanding of Exercise Prescription  Yes       Intervention  Provide education, explanation, and written materials on patient's individual exercise prescription       Expected Outcomes  Short Term: Able to explain program exercise prescription;Long Term: Able to explain home exercise prescription to exercise independently          Copy of goals given to participant.

## 2018-03-29 ENCOUNTER — Other Ambulatory Visit: Payer: Self-pay

## 2018-03-29 DIAGNOSIS — Z952 Presence of prosthetic heart valve: Secondary | ICD-10-CM

## 2018-03-29 NOTE — Progress Notes (Signed)
Daily Session Note  Patient Details  Name: Andre Erickson MRN: [id number] Date of Birth: [birth year] Referring Provider:     Cardiac Rehab from 03/20/2018 in [facility] Cardiac [facility]  Referring Provider  Shade Flood MD      Encounter Date: 03/29/2018  Check In: Session Check In - 03/29/18 0730      Check-In   Supervising physician immediately available to respond to emergencies  See telemetry face sheet for immediately available ER MD    Location  [facility]-Cardiac & Pulmonary Rehab    Staff Present  Heath Lark, RN, BSN, CCRP;Jessica Briar [place], MA, RCEP, CCRP, Exercise Physiologist;Jayleen Afonso Tessie Fass RCP,RRT,BSRT    Medication changes reported      No    Fall or balance concerns reported     No    Warm-up and Cool-down  Performed as group-led instruction    Resistance Training Performed  Yes    VAD Patient?  No    PAD/SET Patient?  No      Pain Assessment   Currently in Pain?  No/denies          Social History   Tobacco Use  Smoking Status Former Smoker  . Last attempt to quit: 09/27/1978  . Years since quitting: 39.5  Smokeless Tobacco Never Used    Goals Met:  Exercise tolerated well Personal goals reviewed No report of cardiac concerns or symptoms Strength training completed today  Goals Unmet:  Not Applicable  Comments: First full day of exercise!  Patient was oriented to gym and equipment including functions, settings, policies, and procedures.  Patient's individual exercise prescription and treatment plan were reviewed.  All starting workloads were established based on the results of the 6 minute walk test done at initial orientation visit.  The plan for exercise progression was also introduced and progression will be customized based on patient's performance and goals.    Dr. Emily Filbert is Medical Director for [facility].

## 2018-03-31 ENCOUNTER — Other Ambulatory Visit: Payer: Self-pay

## 2018-03-31 ENCOUNTER — Encounter: Payer: Medicaid Other | Admitting: *Deleted

## 2018-03-31 DIAGNOSIS — Z952 Presence of prosthetic heart valve: Secondary | ICD-10-CM | POA: Diagnosis not present

## 2018-03-31 NOTE — Progress Notes (Signed)
Daily Session Note  Patient Details  Name: Andre Erickson MRN: [id number] Date of Birth: 20-Apr-1955 Referring Provider:     Cardiac Rehab from 03/20/2018 in [facility] Cardiac and Pulmonary Rehab  Referring Provider  Shade Flood MD      Encounter Date: 03/31/2018  Check In: Session Check In - 03/31/18 0757      Check-In   Supervising physician immediately available to respond to emergencies  See telemetry face sheet for immediately available ER MD    Location  [facility]-Cardiac & Pulmonary Rehab    Staff Present  Darel Hong, RN BSN;Jessica Luan Pulling, MA, RCEP, CCRP, Exercise Physiologist;Amanda Oletta Darter, [place], ACSM CEP, Exercise Physiologist    Medication changes reported      No    Fall or balance concerns reported     No    Warm-up and Cool-down  Performed as group-led instruction    Resistance Training Performed  Yes    VAD Patient?  No    PAD/SET Patient?  No      Pain Assessment   Currently in Pain?  No/denies          Social History   Tobacco Use  Smoking Status Former Smoker  . Last attempt to quit: 09/27/1978  . Years since quitting: 39.5  Smokeless Tobacco Never Used    Goals Met:  Independence with exercise equipment Exercise tolerated well No report of cardiac concerns or symptoms Strength training completed today  Goals Unmet:  Not Applicable  Comments: Pt able to follow exercise prescription today without complaint.  Will continue to monitor for progression.    Dr. Emily Filbert is Medical Director for [facility].

## 2018-04-12 ENCOUNTER — Encounter: Payer: Self-pay | Admitting: *Deleted

## 2018-04-12 DIAGNOSIS — Z952 Presence of prosthetic heart valve: Secondary | ICD-10-CM

## 2018-04-12 NOTE — Progress Notes (Signed)
Cardiac Individual Treatment Plan  Patient Details  Name: Andre Erickson MRN: [id number] Date of Birth: 01/03/1956 Referring Provider:     Cardiac Rehab from 03/20/2018 in [facility]  Referring Provider  Kiefer, Todd MD      Initial Encounter Date:    Cardiac Rehab from 03/20/2018 in [facility]  Date  03/20/18      Visit Diagnosis: S/P AVR (aortic valve replacement)  Patient's Home Medications on Admission:  Current Outpatient Medications:  .  albuterol (PROVENTIL HFA;VENTOLIN HFA) 108 (90 Base) MCG/ACT inhaler, Inhale 2 puffs into the lungs every 4 (four) hours as needed for wheezing or shortness of breath., Disp: , Rfl:  .  aspirin 81 MG tablet, Take 81 mg by mouth daily. , Disp: , Rfl:  .  chlorhexidine (PERIDEX) 0.12 % solution, Rinse with 15 mls twice daily for 30 seconds. Use after breakfast and at bedtime. Spit out excess. Do not swallow. (Patient not taking: Reported on 03/20/2018), Disp: 480 mL, Rfl: prn .  Cholecalciferol (VITAMIN D-3) 5000 units TABS, Take 5,000 Units by mouth daily., Disp: , Rfl:  .  diclofenac (VOLTAREN) 75 MG EC tablet, Take 75 mg by mouth daily. , Disp: , Rfl:  .  DULoxetine (CYMBALTA) 60 MG capsule, Take 60 mg by mouth daily., Disp: , Rfl:  .  furosemide (LASIX) 40 MG tablet, Take 1 tablet (40 mg total) by mouth 2 (two) times daily., Disp: 180 tablet, Rfl: 1 .  gabapentin (NEURONTIN) 300 MG capsule, Take 300 mg by mouth 2 (two) times daily as needed (pain). , Disp: , Rfl:  .  Ipratropium-Albuterol (COMBIVENT) 20-100 MCG/ACT AERS respimat, Inhale into the lungs., Disp: , Rfl:  .  potassium chloride SA (K-DUR,KLOR-CON) 20 MEQ tablet, Take 1 tablet (20 mEq total) by mouth daily., Disp: 90 tablet, Rfl: 1 .  pravastatin (PRAVACHOL) 40 MG tablet, Take 40 mg by mouth daily., Disp: , Rfl:   Past Medical History: Past Medical History:  Diagnosis Date  . Aortic valve stenosis   . Arthritis   . Asthma    as a  child  . GERD (gastroesophageal reflux disease)   . Hyperlipidemia   . Hypertension   . Morbid obesity (HCC)   . Murmur   . Sleep apnea    trying to use the CPAP    Tobacco Use: Social History   Tobacco Use  Smoking Status Former Smoker  . Last attempt to quit: 09/27/1978  . Years since quitting: 39.5  Smokeless Tobacco Never Used    Labs: Recent Review Flowsheet Data    There is no flowsheet data to display.       Exercise Target Goals: Exercise Program Goal: Individual exercise prescription set using results from initial 6 min walk test and THRR while considering  patient's activity barriers and safety.   Exercise Prescription Goal: Initial exercise prescription builds to 30-45 minutes a day of aerobic activity, 2-3 days per week.  Home exercise guidelines will be given to patient during program as part of exercise prescription that the participant will acknowledge.  Activity Barriers & Risk Stratification: Activity Barriers & Cardiac Risk Stratification - 03/20/18 1423      Activity Barriers & Cardiac Risk Stratification   Activity Barriers  Deconditioning;Muscular Weakness;Shortness of Breath;Balance Concerns;Assistive Device;Decreased Ventricular Function;Joint Problems;Arthritis   R knee pain, Arth in knees and shoulders, R Frozen Shoulder (limited ROM)   Cardiac Risk Stratification  High       6   Minute Walk: 6 Minute Walk    Row Name 03/20/18 1421         6 Minute Walk   Phase  Initial     Distance  933 feet walks with cane, limps due to R knee     Walk Time  6 minutes     # of Rest Breaks  0     MPH  1.77     METS  2.34     RPE  11     VO2 Peak  8.17     Symptoms  No     Resting HR  84 bpm     Resting BP  132/70     Resting Oxygen Saturation   97 %     Exercise Oxygen Saturation  during 6 min walk  89 %     Max Ex. HR  84 bpm     Max Ex. BP  132/70     2 Minute Post BP  142/72        Oxygen Initial Assessment:   Oxygen  Re-Evaluation:   Oxygen Discharge (Final Oxygen Re-Evaluation):   Initial Exercise Prescription: Initial Exercise Prescription - 03/20/18 1400      Date of Initial Exercise RX and Referring Provider   Date  03/20/18    Referring Provider  Kiefer, Todd MD      Recumbant Elliptical   Level  1    RPM  50    Minutes  15    METs  2.3      T5 Nustep   Level  1    SPM  80    Minutes  15    METs  2.3      Track   Laps  27    Minutes  15    METs  2.23      Prescription Details   Frequency (times per week)  3    Duration  Progress to 45 minutes of aerobic exercise without signs/symptoms of physical distress      Intensity   THRR 40-80% of Max Heartrate  114-144    Ratings of Perceived Exertion  11-13    Perceived Dyspnea  0-4      Progression   Progression  Continue to progress workloads to maintain intensity without signs/symptoms of physical distress.      Resistance Training   Training Prescription  Yes    Weight  3 lbs    Reps  10-15       Perform Capillary Blood Glucose checks as needed.  Exercise Prescription Changes: Exercise Prescription Changes    Row Name 03/20/18 1400 04/04/18 1300           Response to Exercise   Blood Pressure (Admit)  132/70  140/76      Blood Pressure (Exercise)  192/82  132/76      Blood Pressure (Exit)  142/72  128/86      Heart Rate (Admit)  84 bpm  112 bpm      Heart Rate (Exercise)  132 bpm  124 bpm      Heart Rate (Exit)  94 bpm  96 bpm      Oxygen Saturation (Admit)  97 %  -      Oxygen Saturation (Exercise)  89 %  -      Oxygen Saturation (Exit)  95 %  -      Rating of Perceived Exertion (Exercise)  11  13        Symptoms  walking with cane and limp due to R knee  -      Comments  walk test results  second full day of exercise      Duration  -  Progress to 45 minutes of aerobic exercise without signs/symptoms of physical distress      Intensity  -  THRR unchanged        Progression   Progression  -  Continue to  progress workloads to maintain intensity without signs/symptoms of physical distress.      Average METs  -  1.63        Resistance Training   Training Prescription  -  Yes      Weight  -  3 lbs      Reps  -  10-15        Interval Training   Interval Training  -  No        Treadmill   MPH  -  0.9      Grade  -  0      Minutes  -  15      METs  -  1.7        Recumbant Elliptical   Level  -  1      Minutes  -  15      METs  -  1.3        T5 Nustep   Level  -  1      Minutes  -  15      METs  -  1.9        Home Exercise Plan   Plans to continue exercise at  -  Home (comment) walking      Frequency  -  Add 2 additional days to program exercise sessions.      Initial Home Exercises Provided  -  04/04/18 sent via mail         Exercise Comments: Exercise Comments    Row Name 03/29/18 0731           Exercise Comments  First full day of exercise!  Patient was oriented to gym and equipment including functions, settings, policies, and procedures.  Patient's individual exercise prescription and treatment plan were reviewed.  All starting workloads were established based on the results of the 6 minute walk test done at initial orientation visit.  The plan for exercise progression was also introduced and progression will be customized based on patient's performance and goals.          Exercise Goals and Review: Exercise Goals    Row Name 03/20/18 1433             Exercise Goals   Increase Physical Activity  Yes       Intervention  Provide advice, education, support and counseling about physical activity/exercise needs.;Develop an individualized exercise prescription for aerobic and resistive training based on initial evaluation findings, risk stratification, comorbidities and participant's personal goals.       Expected Outcomes  Short Term: Attend rehab on a regular basis to increase amount of physical activity.;Long Term: Add in home exercise to make exercise part of  routine and to increase amount of physical activity.;Long Term: Exercising regularly at least 3-5 days a week.       Increase Strength and Stamina  Yes       Intervention  Provide advice, education, support and counseling about physical activity/exercise needs.;Develop an individualized exercise prescription for aerobic and resistive  training based on initial evaluation findings, risk stratification, comorbidities and participant's personal goals.       Expected Outcomes  Long Term: Improve cardiorespiratory fitness, muscular endurance and strength as measured by increased METs and functional capacity (6MWT);Short Term: Perform resistance training exercises routinely during rehab and add in resistance training at home;Short Term: Increase workloads from initial exercise prescription for resistance, speed, and METs.       Able to understand and use rate of perceived exertion (RPE) scale  Yes       Intervention  Provide education and explanation on how to use RPE scale       Expected Outcomes  Short Term: Able to use RPE daily in rehab to express subjective intensity level;Long Term:  Able to use RPE to guide intensity level when exercising independently       Knowledge and understanding of Target Heart Rate Range (THRR)  Yes       Intervention  Provide education and explanation of THRR including how the numbers were predicted and where they are located for reference       Expected Outcomes  Short Term: Able to state/look up THRR;Long Term: Able to use THRR to govern intensity when exercising independently;Short Term: Able to use daily as guideline for intensity in rehab       Able to check pulse independently  Yes       Intervention  Provide education and demonstration on how to check pulse in carotid and radial arteries.;Review the importance of being able to check your own pulse for safety during independent exercise       Expected Outcomes  Short Term: Able to explain why pulse checking is important  during independent exercise;Long Term: Able to check pulse independently and accurately       Understanding of Exercise Prescription  Yes       Intervention  Provide education, explanation, and written materials on patient's individual exercise prescription       Expected Outcomes  Short Term: Able to explain program exercise prescription;Long Term: Able to explain home exercise prescription to exercise independently          Exercise Goals Re-Evaluation : Exercise Goals Re-Evaluation    Row Name 03/29/18 0731 04/04/18 1147 04/06/18 1321         Exercise Goal Re-Evaluation   Exercise Goals Review  Able to understand and use rate of perceived exertion (RPE) scale;Knowledge and understanding of Target Heart Rate Range (THRR);Understanding of Exercise Prescription  Increase Physical Activity;Increase Strength and Stamina;Understanding of Exercise Prescription  Increase Physical Activity;Increase Strength and Stamina;Understanding of Exercise Prescription     Comments  Reviewed RPE scale, THR and program prescription with pt today.  Pt voiced understanding and was given a copy of goals to take home.   Andre Erickson has completed two full days of exercise.  Since we are currently closed, we have sent out his home exercise guidelines in the mail and will be doing follow up phone calls.    Called to review home exercise with Andre Erickson over the phone.   Reviewed his target heart rate with him.  He has been walking some but hasn't been out today since it has been raining off and on.  Encouraged him to use our videos for the chair exercises and to repeat it twice to get his full 30 min.      Expected Outcomes  Short: Use RPE daily to regulate intensity. Long: Follow program prescription in THR.  Short: Start to  walk and follow videos at home.  Long: Continue to increase strength and stamina.   Short: Use chair exercises and walk at home.  Long: Continue to build strength and stamina.         Discharge Exercise  Prescription (Final Exercise Prescription Changes): Exercise Prescription Changes - 04/04/18 1300      Response to Exercise   Blood Pressure (Admit)  140/76    Blood Pressure (Exercise)  132/76    Blood Pressure (Exit)  128/86    Heart Rate (Admit)  112 bpm    Heart Rate (Exercise)  124 bpm    Heart Rate (Exit)  96 bpm    Rating of Perceived Exertion (Exercise)  13    Comments  second full day of exercise    Duration  Progress to 45 minutes of aerobic exercise without signs/symptoms of physical distress    Intensity  THRR unchanged      Progression   Progression  Continue to progress workloads to maintain intensity without signs/symptoms of physical distress.    Average METs  1.63      Resistance Training   Training Prescription  Yes    Weight  3 lbs    Reps  10-15      Interval Training   Interval Training  No      Treadmill   MPH  0.9    Grade  0    Minutes  15    METs  1.7      Recumbant Elliptical   Level  1    Minutes  15    METs  1.3      T5 Nustep   Level  1    Minutes  15    METs  1.9      Home Exercise Plan   Plans to continue exercise at  Home (comment)   walking   Frequency  Add 2 additional days to program exercise sessions.    Initial Home Exercises Provided  04/04/18   sent via mail      Nutrition:  Target Goals: Understanding of nutrition guidelines, daily intake of sodium <1500mg, cholesterol <200mg, calories 30% from fat and 7% or less from saturated fats, daily to have 5 or more servings of fruits and vegetables.  Biometrics: Pre Biometrics - 03/20/18 1434      Pre Biometrics   Height  5' 8.9" (1.75 m)    Weight  (!) 334 lb 14.4 oz (151.9 kg)    Waist Circumference  52 inches    Hip Circumference  58 inches    Waist to Hip Ratio  0.9 %    BMI (Calculated)  49.6    Single Leg Stand  334.9 seconds        Nutrition Therapy Plan and Nutrition Goals: Nutrition Therapy & Goals - 03/20/18 1420      Intervention Plan   Intervention   Prescribe, educate and counsel regarding individualized specific dietary modifications aiming towards targeted core components such as weight, hypertension, lipid management, diabetes, heart failure and other comorbidities.;Nutrition handout(s) given to patient.    Expected Outcomes  Short Term Goal: Understand basic principles of dietary content, such as calories, fat, sodium, cholesterol and nutrients.;Short Term Goal: A plan has been developed with personal nutrition goals set during dietitian appointment.;Long Term Goal: Adherence to prescribed nutrition plan.       Nutrition Assessments: Nutrition Assessments - 03/20/18 1420      MEDFICTS Scores   Pre Score  41         Nutrition Goals Re-Evaluation:   Nutrition Goals Discharge (Final Nutrition Goals Re-Evaluation):   Psychosocial: Target Goals: Acknowledge presence or absence of significant depression and/or stress, maximize coping skills, provide positive support system. Participant is able to verbalize types and ability to use techniques and skills needed for reducing stress and depression.   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 His incision is currently still healing, and his MD said no caring for the animals until it is completely healed. 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 He does notice a huge improvement in his SOB, energy, and sleep habits since getting his valve replacement      Family Dynamics   Good Support System?  Yes   wife     Barriers   Psychosocial barriers to participate in program  There are no identifiable barriers or psychosocial needs.;The  patient should benefit from training in stress management and relaxation.      Screening Interventions   Interventions  Encouraged to exercise;Program counselor consult;Provide feedback about the scores to participant;To provide support and resources with identified psychosocial needs    Expected Outcomes  Short Term goal: Utilizing psychosocial counselor, staff and physician to assist with identification of specific Stressors or current issues interfering with healing process. Setting desired goal for each stressor or current issue identified.;Long Term Goal: Stressors or current issues are controlled or eliminated.;Short Term goal: Identification and review with participant of any Quality of Life or Depression concerns found by scoring the questionnaire.;Long Term goal: The participant improves quality of Life and PHQ9 Scores as seen by post scores and/or verbalization of changes       Quality of Life Scores:  Quality of Life - 03/20/18 1420      Quality of Life   Select  Quality of Life      Quality of Life Scores   Health/Function Pre  21.17 %    Socioeconomic Pre  22.92 %    Psych/Spiritual Pre  23.93 %    Family Pre  22.8 %    GLOBAL Pre  22.32 %      Scores of 19 and below usually indicate a poorer quality of life in these areas.  A difference of  2-3 points is a clinically meaningful difference.  A difference of 2-3 points in the total score of the Quality of Life Index has been associated with significant improvement in overall quality of life, self-image, physical symptoms, and general health in studies assessing change in quality of life.  PHQ-9: Recent Review Flowsheet Data    Depression screen PHQ 2/9 03/20/2018   Decreased Interest 1   Down, Depressed, Hopeless 1   PHQ - 2 Score 2   Altered sleeping 0   Tired, decreased energy 1   Change in appetite 0   Feeling bad or failure about yourself  0   Trouble concentrating 0   Moving slowly or fidgety/restless 0   Suicidal  thoughts 0   PHQ-9 Score 3   Difficult doing work/chores Somewhat difficult     Interpretation of Total Score  Total Score Depression Severity:  1-4 = Minimal depression, 5-9 =   Mild depression, 10-14 = Moderate depression, 15-19 = Moderately severe depression, 20-27 = Severe depression   Psychosocial Evaluation and Intervention: Psychosocial Evaluation - 04/05/18 1623      Psychosocial Evaluation & Interventions   Interventions  Encouraged to exercise with the program and follow exercise prescription    Comments  counselor K. Gibbs documenting in Jessica H's session: counselor called patient to conduct initial assessment.  Patient reported that his doctor had referred him to the program as it would be beneficial to his general health.  Patient listed his spouse Elizabeth and their church family as primary supports.  He did mention that he has degenerative arthritis and has 'painful knees.'  No concerns reported with sleeping or eating.  His stressors include having numbness in his left leg and his mind wanting to do things that his body can't yet manage.    Expected Outcomes  Short term goals: increase energy and stamina, exercise more; long term goals: to be able to resume yardwork and caring for rabbits    Continue Psychosocial Services   Follow up required by staff       Psychosocial Re-Evaluation:   Psychosocial Discharge (Final Psychosocial Re-Evaluation):   Vocational Rehabilitation: Provide vocational rehab assistance to qualifying candidates.   Vocational Rehab Evaluation & Intervention: Vocational Rehab - 03/20/18 1415      Initial Vocational Rehab Evaluation & Intervention   Assessment shows need for Vocational Rehabilitation  No       Education: Education Goals: Education classes will be provided on a variety of topics geared toward better understanding of heart health and risk factor modification. Participant will state understanding/return demonstration of topics  presented as noted by education test scores.  Learning Barriers/Preferences: Learning Barriers/Preferences - 03/20/18 1412      Learning Barriers/Preferences   Learning Barriers  Exercise Concerns    Learning Preferences  Group Instruction;Individual Instruction;Verbal Instruction;Skilled Demonstration;Written Material;Video       Education Topics:  AED/CPR: - Group verbal and written instruction with the use of models to demonstrate the basic use of the AED with the basic ABC's of resuscitation.   General Nutrition Guidelines/Fats and Fiber: -Group instruction provided by verbal, written material, models and posters to present the general guidelines for heart healthy nutrition. Gives an explanation and review of dietary fats and fiber.   Controlling Sodium/Reading Food Labels: -Group verbal and written material supporting the discussion of sodium use in heart healthy nutrition. Review and explanation with models, verbal and written materials for utilization of the food label.   Cardiac Rehab from 03/29/2018 in [facility]  Date  03/29/18  Educator  [facility]  Instruction Review Code  1- Verbalizes Understanding      Exercise Physiology & General Exercise Guidelines: - Group verbal and written instruction with models to review the exercise physiology of the cardiovascular system and associated critical values. Provides general exercise guidelines with specific guidelines to those with heart or lung disease.    Aerobic Exercise & Resistance Training: - Gives group verbal and written instruction on the various components of exercise. Focuses on aerobic and resistive training programs and the benefits of this training and how to safely progress through these programs..   Flexibility, Balance, Mind/Body Relaxation: Provides group verbal/written instruction on the benefits of flexibility and balance training, including mind/body exercise modes such as yoga, pilates and  tai chi.  Demonstration and skill practice provided.   Stress and Anxiety: - Provides group verbal and written instruction about the health   risks of elevated stress and causes of high stress.  Discuss the correlation between heart/lung disease and anxiety and treatment options. Review healthy ways to manage with stress and anxiety.   Depression: - Provides group verbal and written instruction on the correlation between heart/lung disease and depressed mood, treatment options, and the stigmas associated with seeking treatment.   Anatomy & Physiology of the Heart: - Group verbal and written instruction and models provide basic cardiac anatomy and physiology, with the coronary electrical and arterial systems. Review of Valvular disease and Heart Failure   Cardiac Procedures: - Group verbal and written instruction to review commonly prescribed medications for heart disease. Reviews the medication, class of the drug, and side effects. Includes the steps to properly store meds and maintain the prescription regimen. (beta blockers and nitrates)   Cardiac Medications I: - Group verbal and written instruction to review commonly prescribed medications for heart disease. Reviews the medication, class of the drug, and side effects. Includes the steps to properly store meds and maintain the prescription regimen.   Cardiac Medications II: -Group verbal and written instruction to review commonly prescribed medications for heart disease. Reviews the medication, class of the drug, and side effects. (all other drug classes)    Go Sex-Intimacy & Heart Disease, Get SMART - Goal Setting: - Group verbal and written instruction through game format to discuss heart disease and the return to sexual intimacy. Provides group verbal and written material to discuss and apply goal setting through the application of the S.M.A.R.T. Method.   Other Matters of the Heart: - Provides group verbal, written materials and  models to describe Stable Angina and Peripheral Artery. Includes description of the disease process and treatment options available to the cardiac patient.   Exercise & Equipment Safety: - Individual verbal instruction and demonstration of equipment use and safety with use of the equipment.   Cardiac Rehab from 03/29/2018 in [facility]  Date  03/20/18  Educator  [facility]  Instruction Review Code  1- Verbalizes Understanding      Infection Prevention: - Provides verbal and written material to individual with discussion of infection control including proper hand washing and proper equipment cleaning during exercise session.   Cardiac Rehab from 03/29/2018 in [facility]  Date  03/20/18  Educator  [facility]  Instruction Review Code  1- Verbalizes Understanding      Falls Prevention: - Provides verbal and written material to individual with discussion of falls prevention and safety.   Cardiac Rehab from 03/29/2018 in [facility] Cardiac and Pulmonary Rehab  Date  03/20/18  Educator  [facility]  Instruction Review Code  1- Verbalizes Understanding      Diabetes: - Individual verbal and written instruction to review signs/symptoms of diabetes, desired ranges of glucose level fasting, after meals and with exercise. Acknowledge that pre and post exercise glucose checks will be done for 3 sessions at entry of program.   Know Your Numbers and Risk Factors: -Group verbal and written instruction about important numbers in your health.  Discussion of what are risk factors and how they play a role in the disease process.  Review of Cholesterol, Blood Pressure, Diabetes, and BMI and the role they play in your overall health.   Sleep Hygiene: -Provides group verbal and written instruction about how sleep can affect your health.  Define sleep hygiene, discuss sleep cycles and impact of sleep habits. Review good sleep hygiene tips.    Other: -Provides group and verbal  instruction  on various topics (see comments)   Knowledge Questionnaire Score: Knowledge Questionnaire Score - 03/20/18 1414      Knowledge Questionnaire Score   Pre Score  23/26   correct answers reviewed with Serigne. Focus on Angina      Core Components/Risk Factors/Patient Goals at Admission: Personal Goals and Risk Factors at Admission - 03/20/18 1411      Core Components/Risk Factors/Patient Goals on Admission    Weight Management  Yes;Obesity;Weight Loss    Intervention  Weight Management: Develop a combined nutrition and exercise program designed to reach desired caloric intake, while maintaining appropriate intake of nutrient and fiber, sodium and fats, and appropriate energy expenditure required for the weight goal.;Weight Management: Provide education and appropriate resources to help participant work on and attain dietary goals.;Weight Management/Obesity: Establish reasonable short term and long term weight goals.;Obesity: Provide education and appropriate resources to help participant work on and attain dietary goals.    Admit Weight  334 lb 12.8 oz (151.9 kg)    Goal Weight: Short Term  330 lb (149.7 kg)    Goal Weight: Long Term  250 lb (113.4 kg)    Expected Outcomes  Short Term: Continue to assess and modify interventions until short term weight is achieved;Long Term: Adherence to nutrition and physical activity/exercise program aimed toward attainment of established weight goal;Weight Loss: Understanding of general recommendations for a balanced deficit meal plan, which promotes 1-2 lb weight loss per week and includes a negative energy balance of 500-1000 kcal/d;Understanding recommendations for meals to include 15-35% energy as protein, 25-35% energy from fat, 35-60% energy from carbohydrates, less than 200mg of dietary cholesterol, 20-35 gm of total fiber daily;Understanding of distribution of calorie intake throughout the day with the consumption of 4-5 meals/snacks    Heart Failure  Yes     Intervention  Provide a combined exercise and nutrition program that is supplemented with education, support and counseling about heart failure. Directed toward relieving symptoms such as shortness of breath, decreased exercise tolerance, and extremity edema.    Expected Outcomes  Improve functional capacity of life;Short term: Daily weights obtained and reported for increase. Utilizing diuretic protocols set by physician.;Short term: Attendance in program 2-3 days a week with increased exercise capacity. Reported lower sodium intake. Reported increased fruit and vegetable intake. Reports medication compliance.;Long term: Adoption of self-care skills and reduction of barriers for early signs and symptoms recognition and intervention leading to self-care maintenance.    Hypertension  Yes    Intervention  Provide education on lifestyle modifcations including regular physical activity/exercise, weight management, moderate sodium restriction and increased consumption of fresh fruit, vegetables, and low fat dairy, alcohol moderation, and smoking cessation.;Monitor prescription use compliance.    Expected Outcomes  Short Term: Continued assessment and intervention until BP is < 140/90mm HG in hypertensive participants. < 130/80mm HG in hypertensive participants with diabetes, heart failure or chronic kidney disease.;Long Term: Maintenance of blood pressure at goal levels.    Lipids  Yes    Intervention  Provide education and support for participant on nutrition & aerobic/resistive exercise along with prescribed medications to achieve LDL <70mg, HDL >40mg.    Expected Outcomes  Short Term: Participant states understanding of desired cholesterol values and is compliant with medications prescribed. Participant is following exercise prescription and nutrition guidelines.;Long Term: Cholesterol controlled with medications as prescribed, with individualized exercise RX and with personalized nutrition plan. Value  goals: LDL < 70mg, HDL > 40 mg.         Core Components/Risk Factors/Patient Goals Review:    Core Components/Risk Factors/Patient Goals at Discharge (Final Review):    ITP Comments: ITP Comments    Row Name 03/20/18 1337 04/04/18 1147 04/12/18 1154       ITP Comments  Med Review completed. Initial ITP created. Diagnosis can be found in CE 2/17  Our program is currently closed due to COVID-19.  We are communicating with patient via phone calls and emails.    30 day review. Continue with ITP unless directed changes by Medical Director chart review.        Comments:  

## 2018-04-17 ENCOUNTER — Encounter: Payer: Self-pay | Admitting: *Deleted

## 2018-04-17 DIAGNOSIS — Z952 Presence of prosthetic heart valve: Secondary | ICD-10-CM

## 2018-05-03 ENCOUNTER — Encounter: Payer: Self-pay | Admitting: *Deleted

## 2018-05-03 DIAGNOSIS — Z952 Presence of prosthetic heart valve: Secondary | ICD-10-CM

## 2018-05-09 ENCOUNTER — Encounter: Payer: Self-pay | Admitting: *Deleted

## 2018-05-09 DIAGNOSIS — Z952 Presence of prosthetic heart valve: Secondary | ICD-10-CM

## 2018-05-09 NOTE — Progress Notes (Signed)
Discharge Progress Report  Patient Details  Name: Andre Erickson MRN: [id number] Date of Birth: 1955/09/20 Referring Provider:     Cardiac Rehab from 03/20/2018 in [facility]  Referring Provider  Oris Drone MD       Number of Visits: 3  Reason for Discharge:  Early Exit:  Deceased  Smoking History:  Social History   Tobacco Use  Smoking Status Former Smoker  . Last attempt to quit: 09/27/1978  . Years since quitting: 39.6  Smokeless Tobacco Never Used    Diagnosis:  S/P AVR (aortic valve replacement)  ADL [facility]:   Initial Exercise Prescription: Initial Exercise Prescription - 03/20/18 1400      Date of Initial Exercise RX and Referring Provider   Date  03/20/18    Referring Provider  Oris Drone MD      Recumbant Elliptical   Level  1    RPM  50    Minutes  15    METs  2.3      T5 Nustep   Level  1    SPM  80    Minutes  15    METs  2.3      Track   Laps  27    Minutes  15    METs  2.23      Prescription Details   Frequency (times per week)  3    Duration  Progress to 45 minutes of aerobic exercise without signs/symptoms of physical distress      Intensity   THRR 40-80% of Max Heartrate  114-144    Ratings of Perceived Exertion  11-13    Perceived Dyspnea  0-4      Progression   Progression  Continue to progress workloads to maintain intensity without signs/symptoms of physical distress.      Resistance Training   Training Prescription  Yes    Weight  3 lbs    Reps  10-15       Discharge Exercise Prescription (Final Exercise Prescription Changes): Exercise Prescription Changes - 04/04/18 1300      Response to Exercise   Blood Pressure (Admit)  140/76    Blood Pressure (Exercise)  132/76    Blood Pressure (Exit)  128/86    Heart Rate (Admit)  112 bpm    Heart Rate (Exercise)  124 bpm    Heart Rate (Exit)  96 bpm    Rating of Perceived Exertion (Exercise)  13    Comments  second full day of exercise     Duration  Progress to 45 minutes of aerobic exercise without signs/symptoms of physical distress    Intensity  THRR unchanged      Progression   Progression  Continue to progress workloads to maintain intensity without signs/symptoms of physical distress.    Average METs  1.63      Resistance Training   Training Prescription  Yes    Weight  3 lbs    Reps  10-15      Interval Training   Interval Training  No      Treadmill   MPH  0.9    Grade  0    Minutes  15    METs  1.7      Recumbant Elliptical   Level  1    Minutes  15    METs  1.3      T5 Nustep   Level  1    Minutes  15  METs  1.9      Home Exercise Plan   Plans to continue exercise at  Home (comment)   walking   Frequency  Add 2 additional days to program exercise sessions.    Initial Home Exercises Provided  04/04/18   sent via mail      Functional Capacity: 6 Minute Walk    Row Name 03/20/18 1421         6 Minute Walk   Phase  Initial     Distance  933 feet walks with cane, limps due to R knee     Walk Time  6 minutes     # of Rest Breaks  0     MPH  1.77     METS  2.34     RPE  11     VO2 Peak  8.17     Symptoms  No     Resting HR  84 bpm     Resting BP  132/70     Resting Oxygen Saturation   97 %     Exercise Oxygen Saturation  during 6 min walk  89 %     Max Ex. HR  84 bpm     Max Ex. BP  132/70     2 Minute Post BP  142/72        Psychological, QOL, Others - Outcomes: PHQ 2/9: Depression screen PHQ 2/9 03/20/2018  Decreased Interest 1  Down, Depressed, Hopeless 1  PHQ - 2 Score 2  Altered sleeping 0  Tired, decreased energy 1  Change in appetite 0  Feeling bad or failure about yourself  0  Trouble concentrating 0  Moving slowly or fidgety/restless 0  Suicidal thoughts 0  PHQ-9 Score 3  Difficult doing work/chores Somewhat difficult    Quality of Life: Quality of Life - 03/20/18 1420      Quality of Life   Select  Quality of Life      Quality of Life Scores    Health/Function Pre  21.17 %    Socioeconomic Pre  22.92 %    Psych/Spiritual Pre  23.93 %    Family Pre  22.8 %    GLOBAL Pre  22.32 %       Personal Goals: Goals established at orientation with interventions provided to work toward goal. Personal Goals and Risk Factors at Admission - 03/20/18 1411      Core Components/Risk Factors/Patient Goals on Admission    Weight Management  Yes;Obesity;Weight Loss    Intervention  Weight Management: Develop a combined nutrition and exercise program designed to reach desired caloric intake, while maintaining appropriate intake of nutrient and fiber, sodium and fats, and appropriate energy expenditure required for the weight goal.;Weight Management: Provide education and appropriate resources to help participant work on and attain dietary goals.;Weight Management/Obesity: Establish reasonable short term and long term weight goals.;Obesity: Provide education and appropriate resources to help participant work on and attain dietary goals.    Admit Weight  334 lb 12.8 oz (151.9 kg)    Goal Weight: Short Term  330 lb (149.7 kg)    Goal Weight: Long Term  250 lb (113.4 kg)    Expected Outcomes  Short Term: Continue to assess and modify interventions until short term weight is achieved;Long Term: Adherence to nutrition and physical activity/exercise program aimed toward attainment of established weight goal;Weight Loss: Understanding of general recommendations for a balanced deficit meal plan, which promotes 1-2 lb weight loss per week  and includes a negative energy balance of [phone] kcal/d;Understanding recommendations for meals to include 15-35% energy as protein, 25-35% energy from fat, 35-60% energy from carbohydrates, less than 200mg  of dietary cholesterol, 20-35 gm of total fiber daily;Understanding of distribution of calorie intake throughout the day with the consumption of 4-5 meals/snacks    Heart Failure  Yes    Intervention  Provide a combined exercise  and nutrition program that is supplemented with education, support and counseling about heart failure. Directed toward relieving symptoms such as shortness of breath, decreased exercise tolerance, and extremity edema.    Expected Outcomes  Improve functional capacity of life;Short term: Daily weights obtained and reported for increase. Utilizing diuretic protocols set by physician.;Short term: Attendance in program 2-3 days a week with increased exercise capacity. Reported lower sodium intake. Reported increased fruit and vegetable intake. Reports medication compliance.;Long term: Adoption of self-care skills and reduction of barriers for early signs and symptoms recognition and intervention leading to self-care maintenance.    Hypertension  Yes    Intervention  Provide education on lifestyle modifcations including regular physical activity/exercise, weight management, moderate sodium restriction and increased consumption of fresh fruit, vegetables, and low fat dairy, alcohol moderation, and smoking cessation.;Monitor prescription use compliance.    Expected Outcomes  Short Term: Continued assessment and intervention until BP is < 140/15mm HG in hypertensive participants. < 130/71mm HG in hypertensive participants with diabetes, heart failure or chronic kidney disease.;Long Term: Maintenance of blood pressure at goal levels.    Lipids  Yes    Intervention  Provide education and support for participant on nutrition & aerobic/resistive exercise along with prescribed medications to achieve LDL 70mg , HDL >40mg .    Expected Outcomes  Short Term: Participant states understanding of desired cholesterol values and is compliant with medications prescribed. Participant is following exercise prescription and nutrition guidelines.;Long Term: Cholesterol controlled with medications as prescribed, with individualized exercise RX and with personalized nutrition plan. Value goals: LDL < 70mg , HDL > 40 mg.         Personal Goals Discharge: Goals and Risk Factor Review    Row Name 04/17/18 1039 05/03/18 1203           Core Components/Risk Factors/Patient Goals Review   Personal Goals Review  Weight Management/Obesity;Heart Failure  Weight Management/Obesity;Heart Failure      Review  Spoke with Erle's wife today. Overall he is doing well.  There biggest concern was that his weight is up some and his legs are still swelling.  He was encouraged to take an extra dose of Lasix to try to get the fluid off.  We also reviewed when to call the doctor about watching his weight and swelling.  They were encouraged to reach out to doctor if he continues to have swelling in his legs and if his weight is up more than 5lbs over the week or 3 lbs in a day.  Spoke with Daivon's wife again today.  Overall he is doing well.  He still has issues with his weight and swelling. They are working to get it down.  He was encouraged to limit chair time to no more than two hours at a time and to get out and work more as able.  He was also encouraged to keep walking and ice knees afterwards.  His pressures have been good and they are happy with them.        Expected Outcomes  Short: Try extra Lasix to help with weight and call doctor  Long: Continue to manage heart failure.   Short: Continue to work on weight loss. Long: Continue to monitor risk factors.          Exercise Goals and Review: Exercise Goals    Row Name 03/20/18 1433             Exercise Goals   Increase Physical Activity  Yes       Intervention  Provide advice, education, support and counseling about physical activity/exercise needs.;Develop an individualized exercise prescription for aerobic and resistive training based on initial evaluation findings, risk stratification, comorbidities and participant's personal goals.       Expected Outcomes  Short Term: Attend rehab on a regular basis to increase amount of physical activity.;Long Term: Add in home exercise to  make exercise part of routine and to increase amount of physical activity.;Long Term: Exercising regularly at least 3-5 days a week.       Increase Strength and Stamina  Yes       Intervention  Provide advice, education, support and counseling about physical activity/exercise needs.;Develop an individualized exercise prescription for aerobic and resistive training based on initial evaluation findings, risk stratification, comorbidities and participant's personal goals.       Expected Outcomes  Long Term: Improve cardiorespiratory fitness, muscular endurance and strength as measured by increased METs and functional capacity ( );Short Term: Perform resistance training exercises routinely during rehab and add in resistance training at home;Short Term: Increase workloads from initial exercise prescription for resistance, speed, and METs.       Able to understand and use rate of perceived exertion (RPE) scale  Yes       Intervention  Provide education and explanation on how to use RPE scale       Expected Outcomes  Short Term: Able to use RPE daily in rehab to express subjective intensity level;Long Term:  Able to use RPE to guide intensity level when exercising independently       Knowledge and understanding of Target Heart Rate Range (THRR)  Yes       Intervention  Provide education and explanation of THRR including how the numbers were predicted and where they are located for reference       Expected Outcomes  Short Term: Able to state/look up THRR;Long Term: Able to use THRR to govern intensity when exercising independently;Short Term: Able to use daily as guideline for intensity in rehab       Able to check pulse independently  Yes       Intervention  Provide education and demonstration on how to check pulse in carotid and radial arteries.;Review the importance of being able to check your own pulse for safety during independent exercise       Expected Outcomes  Short Term: Able to explain why pulse  checking is important during independent exercise;Long Term: Able to check pulse independently and accurately       Understanding of Exercise Prescription  Yes       Intervention  Provide education, explanation, and written materials on patient's individual exercise prescription       Expected Outcomes  Short Term: Able to explain program exercise prescription;Long Term: Able to explain home exercise prescription to exercise independently          Exercise Goals Re-Evaluation: Exercise Goals Re-Evaluation    Row Name 03/29/18 0731 04/04/18 1147 04/06/18 1321 04/17/18 1038 05/03/18 1150     Exercise Goal Re-Evaluation   Exercise Goals Review  Able to understand and  use rate of perceived exertion (RPE) scale;Knowledge and understanding of Target Heart Rate Range (THRR);Understanding of Exercise Prescription  Increase Physical Activity;Increase Strength and Stamina;Understanding of Exercise Prescription  Increase Physical Activity;Increase Strength and Stamina;Understanding of Exercise Prescription  Increase Physical Activity;Increase Strength and Stamina;Understanding of Exercise Prescription  Increase Physical Activity;Increase Strength and Stamina;Understanding of Exercise Prescription   Comments  Reviewed RPE scale, THR and program prescription with pt today.  Pt voiced understanding and was given a copy of goals to take home.   Lollie SailsHarry has completed two full days of exercise.  Since we are currently closed, we have sent out his home exercise guidelines in the mail and will be doing follow up phone calls.    Called to review home exercise with Lollie SailsHarry over the phone.   Reviewed his target heart rate with him.  He has been walking some but hasn't been out today since it has been raining off and on.  Encouraged him to use our videos for the chair exercises and to repeat it twice to get his full 30 min.   Spoke with Taj's wife.  They did get their packet and he is getting in some exercise.  He is not doing  everything but some things. He is trying to stay active by working around the house and out in the yard.    Spoke with Trevyon's wife.  He continues to walk some and getting outside.  He does spend a lot of time in his reclinder and was encouraged to limit to no more than two hours at a time.  We also talked about using the chair exercise videos to keep him active.  She noted that Lollie SailsHarry has been having problems with his knees when walking. We talked about using ice after walking and using his walker for more support and helping his knee.  She will continue to work with him on his walking.    Expected Outcomes  Short: Use RPE daily to regulate intensity. Long: Follow program prescription in THR.  Short: Start to walk and follow videos at home.  Long: Continue to increase strength and stamina.   Short: Use chair exercises and walk at home.  Long: Continue to build strength and stamina.   Short: Walk more.  Long: Continue to build up activity levels.   Short: Continue to walk.  Long: Continue to build up activity.       Nutrition & Weight - Outcomes: Pre Biometrics - 03/20/18 1434      Pre Biometrics   Height  5' 8.9" (1.75 m)    Weight  (!) 334 lb 14.4 oz (151.9 kg)    Waist Circumference  52 inches    Hip Circumference  58 inches    Waist to Hip Ratio  0.9 %    BMI (Calculated)  49.6    Single Leg Stand  334.9 seconds        Nutrition: Nutrition Therapy & Goals - 03/20/18 1420      Intervention Plan   Intervention  Prescribe, educate and counsel regarding individualized specific dietary modifications aiming towards targeted core components such as weight, hypertension, lipid management, diabetes, heart failure and other comorbidities.;Nutrition handout(s) given to patient.    Expected Outcomes  Short Term Goal: Understand basic principles of dietary content, such as calories, fat, sodium, cholesterol and nutrients.;Short Term Goal: A plan has been developed with personal nutrition goals set  during dietitian appointment.;Long Term Goal: Adherence to prescribed nutrition plan.  Nutrition Discharge: Nutrition Assessments - 03/20/18 1420      MEDFICTS Scores   Pre Score  41       Education Questionnaire Score: Knowledge Questionnaire Score - 03/20/18 1414      Knowledge Questionnaire Score   Pre Score  23/26   correct answers reviewed with Lollie Sails. Focus on Angina      Goals reviewed with patient; copy given to patient.

## 2018-05-09 NOTE — Progress Notes (Signed)
Cardiac Individual Treatment Plan  Patient Details  Name: MAURISIO RUDDY MRN: [id number] Date of Birth: 1955/07/06 Referring Provider:     Cardiac Rehab from 03/20/2018 in [facility] Cardiac and Pulmonary Rehab  Referring Provider  Shade Flood MD      Initial Encounter Date:    Cardiac Rehab from 03/20/2018 in [facility] Cardiac and Pulmonary Rehab  Date  03/20/18      Visit Diagnosis: S/P AVR (aortic valve replacement)  Patient's Home Medications on Admission:  Current Outpatient Medications:  .  albuterol (PROVENTIL HFA;VENTOLIN HFA) 108 (90 Base) MCG/ACT inhaler, Inhale 2 puffs into the lungs every 4 (four) hours as needed for wheezing or shortness of breath., Disp: , Rfl:  .  aspirin 81 MG tablet, Take 81 mg by mouth daily. , Disp: , Rfl:  .  chlorhexidine (PERIDEX) 0.12 % solution, Rinse with 15 mls twice daily for 30 seconds. Use after breakfast and at bedtime. Spit out excess. Do not swallow. (Patient not taking: Reported on 03/20/2018), Disp: 480 mL, Rfl: prn .  Cholecalciferol (VITAMIN D-3) 5000 units TABS, Take 5,000 Units by mouth daily., Disp: , Rfl:  .  diclofenac (VOLTAREN) 75 MG EC tablet, Take 75 mg by mouth daily. , Disp: , Rfl:  .  DULoxetine (CYMBALTA) 60 MG capsule, Take 60 mg by mouth daily., Disp: , Rfl:  .  furosemide (LASIX) 40 MG tablet, Take 1 tablet (40 mg total) by mouth 2 (two) times daily., Disp: 180 tablet, Rfl: 1 .  gabapentin (NEURONTIN) 300 MG capsule, Take 300 mg by mouth 2 (two) times daily as needed (pain). , Disp: , Rfl:  .  Ipratropium-Albuterol (COMBIVENT) 20-100 MCG/ACT AERS respimat, Inhale into the lungs., Disp: , Rfl:  .  potassium chloride SA (K-DUR,KLOR-CON) 20 MEQ tablet, Take 1 tablet (20 mEq total) by mouth daily., Disp: 90 tablet, Rfl: 1 .  pravastatin (PRAVACHOL) 40 MG tablet, Take 40 mg by mouth daily., Disp: , Rfl:   Past Medical History: Past Medical History:  Diagnosis Date  . Aortic valve stenosis   . Arthritis   . Asthma    as a  child  . GERD (gastroesophageal reflux disease)   . Hyperlipidemia   . Hypertension   . Morbid obesity ([place])   . Murmur   . Sleep apnea    trying to use the CPAP    Tobacco Use: Social History   Tobacco Use  Smoking Status Former Smoker  . Last attempt to quit: 09/27/1978  . Years since quitting: 39.6  Smokeless Tobacco Never Used    Labs: Recent Review Flowsheet Data    There is no flowsheet data to display.       Exercise Target Goals: Exercise Program Goal: Individual exercise prescription set using results from initial 6 min walk test and THRR while considering  patient's activity barriers and safety.   Exercise Prescription Goal: Initial exercise prescription builds to 30-45 minutes a day of aerobic activity, 2-3 days per week.  Home exercise guidelines will be given to patient during program as part of exercise prescription that the participant will acknowledge.  Activity Barriers & Risk Stratification: Activity Barriers & Cardiac Risk Stratification - 03/20/18 1423      Activity Barriers & Cardiac Risk Stratification   Activity Barriers  Deconditioning;Muscular Weakness;Shortness of Breath;Balance Concerns;Assistive Device;Decreased Ventricular Function;Joint Problems;Arthritis   R knee pain, Arth in knees and shoulders, R Frozen Shoulder (limited ROM)   Cardiac Risk Stratification  High       6  Minute Walk: 6 Minute Walk    Row Name 03/20/18 1421         6 Minute Walk   Phase  Initial     Distance  933 feet walks with cane, limps due to R knee     Walk Time  6 minutes     # of Rest Breaks  0     MPH  1.77     METS  2.34     RPE  11     VO2 Peak  8.17     Symptoms  No     Resting HR  84 bpm     Resting BP  132/70     Resting Oxygen Saturation   97 %     Exercise Oxygen Saturation  during 6 min walk  89 %     Max Ex. HR  84 bpm     Max Ex. BP  132/70     2 Minute Post BP  142/72        Oxygen Initial Assessment:   Oxygen  Re-Evaluation:   Oxygen Discharge (Final Oxygen Re-Evaluation):   Initial Exercise Prescription: Initial Exercise Prescription - 03/20/18 1400      Date of Initial Exercise RX and Referring Provider   Date  03/20/18    Referring Provider  Kiefer, Todd MD      Recumbant Elliptical   Level  1    RPM  50    Minutes  15    METs  2.3      T5 Nustep   Level  1    SPM  80    Minutes  15    METs  2.3      Track   Laps  27    Minutes  15    METs  2.23      Prescription Details   Frequency (times per week)  3    Duration  Progress to 45 minutes of aerobic exercise without signs/symptoms of physical distress      Intensity   THRR 40-80% of Max Heartrate  114-144    Ratings of Perceived Exertion  11-13    Perceived Dyspnea  0-4      Progression   Progression  Continue to progress workloads to maintain intensity without signs/symptoms of physical distress.      Resistance Training   Training Prescription  Yes    Weight  3 lbs    Reps  10-15       Perform Capillary Blood Glucose checks as needed.  Exercise Prescription Changes: Exercise Prescription Changes    Row Name 03/20/18 1400 04/04/18 1300           Response to Exercise   Blood Pressure (Admit)  132/70  140/76      Blood Pressure (Exercise)  192/82  132/76      Blood Pressure (Exit)  142/72  128/86      Heart Rate (Admit)  84 bpm  112 bpm      Heart Rate (Exercise)  132 bpm  124 bpm      Heart Rate (Exit)  94 bpm  96 bpm      Oxygen Saturation (Admit)  97 %  -      Oxygen Saturation (Exercise)  89 %  -      Oxygen Saturation (Exit)  95 %  -      Rating of Perceived Exertion (Exercise)  11  13        Symptoms  walking with cane and limp due to R knee  -      Comments  walk test results  second full day of exercise      Duration  -  Progress to 45 minutes of aerobic exercise without signs/symptoms of physical distress      Intensity  -  THRR unchanged        Progression   Progression  -  Continue to  progress workloads to maintain intensity without signs/symptoms of physical distress.      Average METs  -  1.63        Resistance Training   Training Prescription  -  Yes      Weight  -  3 lbs      Reps  -  10-15        Interval Training   Interval Training  -  No        Treadmill   MPH  -  0.9      Grade  -  0      Minutes  -  15      METs  -  1.7        Recumbant Elliptical   Level  -  1      Minutes  -  15      METs  -  1.3        T5 Nustep   Level  -  1      Minutes  -  15      METs  -  1.9        Home Exercise Plan   Plans to continue exercise at  -  Home (comment) walking      Frequency  -  Add 2 additional days to program exercise sessions.      Initial Home Exercises Provided  -  04/04/18 sent via mail         Exercise Comments: Exercise Comments    Row Name 03/29/18 0731           Exercise Comments  First full day of exercise!  Patient was oriented to gym and equipment including functions, settings, policies, and procedures.  Patient's individual exercise prescription and treatment plan were reviewed.  All starting workloads were established based on the results of the 6 minute walk test done at initial orientation visit.  The plan for exercise progression was also introduced and progression will be customized based on patient's performance and goals.          Exercise Goals and Review: Exercise Goals    Row Name 03/20/18 1433             Exercise Goals   Increase Physical Activity  Yes       Intervention  Provide advice, education, support and counseling about physical activity/exercise needs.;Develop an individualized exercise prescription for aerobic and resistive training based on initial evaluation findings, risk stratification, comorbidities and participant's personal goals.       Expected Outcomes  Short Term: Attend rehab on a regular basis to increase amount of physical activity.;Long Term: Add in home exercise to make exercise part of  routine and to increase amount of physical activity.;Long Term: Exercising regularly at least 3-5 days a week.       Increase Strength and Stamina  Yes       Intervention  Provide advice, education, support and counseling about physical activity/exercise needs.;Develop an individualized exercise prescription for aerobic and resistive  training based on initial evaluation findings, risk stratification, comorbidities and participant's personal goals.       Expected Outcomes  Long Term: Improve cardiorespiratory fitness, muscular endurance and strength as measured by increased METs and functional capacity (6MWT);Short Term: Perform resistance training exercises routinely during rehab and add in resistance training at home;Short Term: Increase workloads from initial exercise prescription for resistance, speed, and METs.       Able to understand and use rate of perceived exertion (RPE) scale  Yes       Intervention  Provide education and explanation on how to use RPE scale       Expected Outcomes  Short Term: Able to use RPE daily in rehab to express subjective intensity level;Long Term:  Able to use RPE to guide intensity level when exercising independently       Knowledge and understanding of Target Heart Rate Range (THRR)  Yes       Intervention  Provide education and explanation of THRR including how the numbers were predicted and where they are located for reference       Expected Outcomes  Short Term: Able to state/look up THRR;Long Term: Able to use THRR to govern intensity when exercising independently;Short Term: Able to use daily as guideline for intensity in rehab       Able to check pulse independently  Yes       Intervention  Provide education and demonstration on how to check pulse in carotid and radial arteries.;Review the importance of being able to check your own pulse for safety during independent exercise       Expected Outcomes  Short Term: Able to explain why pulse checking is important  during independent exercise;Long Term: Able to check pulse independently and accurately       Understanding of Exercise Prescription  Yes       Intervention  Provide education, explanation, and written materials on patient's individual exercise prescription       Expected Outcomes  Short Term: Able to explain program exercise prescription;Long Term: Able to explain home exercise prescription to exercise independently          Exercise Goals Re-Evaluation : Exercise Goals Re-Evaluation    Row Name 03/29/18 0731 04/04/18 1147 04/06/18 1321 04/17/18 1038 05/03/18 1150     Exercise Goal Re-Evaluation   Exercise Goals Review  Able to understand and use rate of perceived exertion (RPE) scale;Knowledge and understanding of Target Heart Rate Range (THRR);Understanding of Exercise Prescription  Increase Physical Activity;Increase Strength and Stamina;Understanding of Exercise Prescription  Increase Physical Activity;Increase Strength and Stamina;Understanding of Exercise Prescription  Increase Physical Activity;Increase Strength and Stamina;Understanding of Exercise Prescription  Increase Physical Activity;Increase Strength and Stamina;Understanding of Exercise Prescription   Comments  Reviewed RPE scale, THR and program prescription with pt today.  Pt voiced understanding and was given a copy of goals to take home.   Ruger has completed two full days of exercise.  Since we are currently closed, we have sent out his home exercise guidelines in the mail and will be doing follow up phone calls.    Called to review home exercise with Alexio over the phone.   Reviewed his target heart rate with him.  He has been walking some but hasn't been out today since it has been raining off and on.  Encouraged him to use our videos for the chair exercises and to repeat it twice to get his full 30 min.   Spoke with Jiro's wife.  They did get their packet and he is getting in some exercise.  He is not doing everything but some  things. He is trying to stay active by working around the house and out in the yard.    Spoke with Zadrian's wife.  He continues to walk some and getting outside.  He does spend a lot of time in his reclinder and was encouraged to limit to no more than two hours at a time.  We also talked about using the chair exercise videos to keep him active.  She noted that Colum has been having problems with his knees when walking. We talked about using ice after walking and using his walker for more support and helping his knee.  She will continue to work with him on his walking.    Expected Outcomes  Short: Use RPE daily to regulate intensity. Long: Follow program prescription in THR.  Short: Start to walk and follow videos at home.  Long: Continue to increase strength and stamina.   Short: Use chair exercises and walk at home.  Long: Continue to build strength and stamina.   Short: Walk more.  Long: Continue to build up activity levels.   Short: Continue to walk.  Long: Continue to build up activity.       Discharge Exercise Prescription (Final Exercise Prescription Changes): Exercise Prescription Changes - 04/04/18 1300      Response to Exercise   Blood Pressure (Admit)  140/76    Blood Pressure (Exercise)  132/76    Blood Pressure (Exit)  128/86    Heart Rate (Admit)  112 bpm    Heart Rate (Exercise)  124 bpm    Heart Rate (Exit)  96 bpm    Rating of Perceived Exertion (Exercise)  13    Comments  second full day of exercise    Duration  Progress to 45 minutes of aerobic exercise without signs/symptoms of physical distress    Intensity  THRR unchanged      Progression   Progression  Continue to progress workloads to maintain intensity without signs/symptoms of physical distress.    Average METs  1.63      Resistance Training   Training Prescription  Yes    Weight  3 lbs    Reps  10-15      Interval Training   Interval Training  No      Treadmill   MPH  0.9    Grade  0    Minutes  15    METs   1.7      Recumbant Elliptical   Level  1    Minutes  15    METs  1.3      T5 Nustep   Level  1    Minutes  15    METs  1.9      Home Exercise Plan   Plans to continue exercise at  Home (comment)   walking   Frequency  Add 2 additional days to program exercise sessions.    Initial Home Exercises Provided  04/04/18   sent via mail      Nutrition:  Target Goals: Understanding of nutrition guidelines, daily intake of sodium '1500mg'$ , cholesterol '200mg'$ , calories 30% from fat and 7% or less from saturated fats, daily to have 5 or more servings of fruits and vegetables.  Biometrics: Pre Biometrics - 03/20/18 1434      Pre Biometrics   Height  5' 8.9" (1.75 m)    Weight  Marland Kitchen)  334 lb 14.4 oz (151.9 kg)    Waist Circumference  52 inches    Hip Circumference  58 inches    Waist to Hip Ratio  0.9 %    BMI (Calculated)  49.6    Single Leg Stand  334.9 seconds        Nutrition Therapy Plan and Nutrition Goals: Nutrition Therapy & Goals - 03/20/18 1420      Intervention Plan   Intervention  Prescribe, educate and counsel regarding individualized specific dietary modifications aiming towards targeted core components such as weight, hypertension, lipid management, diabetes, heart failure and other comorbidities.;Nutrition handout(s) given to patient.    Expected Outcomes  Short Term Goal: Understand basic principles of dietary content, such as calories, fat, sodium, cholesterol and nutrients.;Short Term Goal: A plan has been developed with personal nutrition goals set during dietitian appointment.;Long Term Goal: Adherence to prescribed nutrition plan.       Nutrition Assessments: Nutrition Assessments - 03/20/18 1420      MEDFICTS Scores   Pre Score  41       Nutrition Goals Re-Evaluation:   Nutrition Goals Discharge (Final Nutrition Goals Re-Evaluation):   Psychosocial: Target Goals: Acknowledge presence or absence of significant depression and/or stress, maximize  coping skills, provide positive support system. Participant is able to verbalize types and ability to use techniques and skills needed for reducing stress and depression.   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 His incision is currently still healing, and his MD said no caring for the animals until it is completely healed. 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 He does notice a huge improvement in his SOB, energy, and sleep habits since getting his valve replacement      Family Dynamics   Good Support System?  Yes   wife     Barriers   Psychosocial barriers to participate in program  There are no identifiable barriers or psychosocial needs.;The patient should benefit from training in stress management and relaxation.      Screening Interventions   Interventions  Encouraged to exercise;Program counselor consult;Provide feedback about the scores to participant;To provide support and resources with identified psychosocial needs    Expected Outcomes  Short Term goal: Utilizing psychosocial counselor, staff and physician to assist with identification of specific Stressors or current issues interfering with healing process. Setting desired goal for each stressor or current issue identified.;Long Term Goal: Stressors or current issues are controlled or eliminated.;Short Term goal: Identification and review with participant of any Quality of Life or Depression concerns found by scoring the questionnaire.;Long Term goal: The participant improves quality of Life and PHQ9 Scores as seen by post scores and/or verbalization of changes       Quality  of Life Scores:  Quality of Life - 03/20/18 1420      Quality of Life   Select  Quality of Life      Quality of Life Scores   Health/Function Pre  21.17 %    Socioeconomic Pre  22.92 %    Psych/Spiritual Pre  23.93 %    Family Pre  22.8 %  GLOBAL Pre  22.32 %      Scores of 19 and below usually indicate a poorer quality of life in these areas.  A difference of  2-3 points is a clinically meaningful difference.  A difference of 2-3 points in the total score of the Quality of Life Index has been associated with significant improvement in overall quality of life, self-image, physical symptoms, and general health in studies assessing change in quality of life.  PHQ-9: Recent Review Flowsheet Data    Depression screen [facility] 2/9 03/20/2018   Decreased Interest 1   Down, Depressed, Hopeless 1   PHQ - 2 Score 2   Altered sleeping 0   Tired, decreased energy 1   Change in appetite 0   Feeling bad or failure about yourself  0   Trouble concentrating 0   Moving slowly or fidgety/restless 0   Suicidal thoughts 0   PHQ-9 Score 3   Difficult doing work/chores Somewhat difficult     Interpretation of Total Score  Total Score Depression Severity:  1-4 = Minimal depression, 5-9 = Mild depression, 10-14 = Moderate depression, 15-19 = Moderately severe depression, 20-27 = Severe depression   Psychosocial Evaluation and Intervention: Psychosocial Evaluation - 04/05/18 1623      Psychosocial Evaluation & Interventions   Interventions  Encouraged to exercise with the program and follow exercise prescription    Comments  counselor Learta Codding documenting in [place] [facility]'s session: counselor called patient to conduct initial assessment.  Patient reported that his doctor had referred him to the program as it would be beneficial to his general health.  Patient listed his spouse Benjamine Mola and their church family as primary supports.  He did mention that he has degenerative arthritis and has 'painful knees.'   No concerns reported with sleeping or eating.  His stressors include having numbness in his left leg and his mind wanting to do things that his body can't yet manage.    Expected Outcomes  Short term goals: increase energy and stamina, exercise more; long term goals: to be able to resume yardwork and caring for rabbits    Continue Psychosocial Services   Follow up required by staff       Psychosocial Re-Evaluation: Psychosocial Re-Evaluation    [place] Name 05/03/18 1157             Psychosocial Re-Evaluation   Current issues with  Current Stress Concerns       Comments  Jarmal is doing okay at home. He spends more time in his chair then he should.  They are doing okay on the famr.  They have one of their animals with a small  hairline fracture that is taking a lot of their attention.  Kort also has continued to complain about having some chest pain post surgery.  I have encouraged them to contact the doctor about this to make sure it's just healing pain and not blockages.  She said they would.        Expected Outcomes  Short: Call doctor about pain.  Long: Continue to care for animals.        Interventions  Encouraged to attend Cardiac Rehabilitation for the exercise       Continue Psychosocial Services   Follow up required by staff          Psychosocial Discharge (Final Psychosocial Re-Evaluation): Psychosocial Re-Evaluation - 05/03/18 1157      Psychosocial Re-Evaluation   Current issues with  Current Stress Concerns  Comments  Aden is doing okay at home. He spends more time in his chair then he should.  They are doing okay on the famr.  They have one of their animals with a small  hairline fracture that is taking a lot of their attention.  Vick also has continued to complain about having some chest pain post surgery.  I have encouraged them to contact the doctor about this to make sure it's just healing pain and not blockages.  She said they would.     Expected Outcomes  Short: Call  doctor about pain.  Long: Continue to care for animals.     Interventions  Encouraged to attend Cardiac Rehabilitation for the exercise    Continue Psychosocial Services   Follow up required by staff       Vocational Rehabilitation: Provide vocational rehab assistance to qualifying candidates.   Vocational Rehab Evaluation & Intervention: Vocational Rehab - 03/20/18 1415      Initial Vocational Rehab Evaluation & Intervention   Assessment shows need for Vocational Rehabilitation  No       Education: Education Goals: Education classes will be provided on a variety of topics geared toward better understanding of heart health and risk factor modification. Participant will state understanding/return demonstration of topics presented as noted by education test scores.  Learning Barriers/Preferences: Learning Barriers/Preferences - 03/20/18 1412      Learning Barriers/Preferences   Learning Barriers  Exercise Concerns    Learning Preferences  Group Instruction;Individual Instruction;Verbal Instruction;Skilled Demonstration;Written Material;Video       Education Topics:  AED/CPR: - Group verbal and written instruction with the use of models to demonstrate the basic use of the AED with the basic ABC's of resuscitation.   General Nutrition Guidelines/Fats and Fiber: -Group instruction provided by verbal, written material, models and posters to present the general guidelines for heart healthy nutrition. Gives an explanation and review of dietary fats and fiber.   Controlling Sodium/Reading Food Labels: -Group verbal and written material supporting the discussion of sodium use in heart healthy nutrition. Review and explanation with models, verbal and written materials for utilization of the food label.   Cardiac Rehab from 03/29/2018 in [facility] Cardiac and Pulmonary Rehab  Date  03/29/18  Educator  [facility]  Instruction Review Code  1- Verbalizes Understanding      Exercise Physiology &  General Exercise Guidelines: - Group verbal and written instruction with models to review the exercise physiology of the cardiovascular system and associated critical values. Provides general exercise guidelines with specific guidelines to those with heart or lung disease.    Aerobic Exercise & Resistance Training: - Gives group verbal and written instruction on the various components of exercise. Focuses on aerobic and resistive training programs and the benefits of this training and how to safely progress through these programs..   Flexibility, Balance, Mind/Body Relaxation: Provides group verbal/written instruction on the benefits of flexibility and balance training, including mind/body exercise modes such as yoga, pilates and tai chi.  Demonstration and skill practice provided.   Stress and Anxiety: - Provides group verbal and written instruction about the health risks of elevated stress and causes of high stress.  Discuss the correlation between heart/lung disease and anxiety and treatment options. Review healthy ways to manage with stress and anxiety.   Depression: - Provides group verbal and written instruction on the correlation between heart/lung disease and depressed mood, treatment options, and the stigmas associated with seeking treatment.   Anatomy & Physiology of the Heart: -  Group verbal and written instruction and models provide basic cardiac anatomy and physiology, with the coronary electrical and arterial systems. Review of Valvular disease and Heart Failure   Cardiac Procedures: - Group verbal and written instruction to review commonly prescribed medications for heart disease. Reviews the medication, class of the drug, and side effects. Includes the steps to properly store meds and maintain the prescription regimen. (beta blockers and nitrates)   Cardiac Medications I: - Group verbal and written instruction to review commonly prescribed medications for heart disease.  Reviews the medication, class of the drug, and side effects. Includes the steps to properly store meds and maintain the prescription regimen.   Cardiac Medications II: -Group verbal and written instruction to review commonly prescribed medications for heart disease. Reviews the medication, class of the drug, and side effects. (all other drug classes)    Go Sex-Intimacy & Heart Disease, Get SMART - Goal Setting: - Group verbal and written instruction through game format to discuss heart disease and the return to sexual intimacy. Provides group verbal and written material to discuss and apply goal setting through the application of the S.M.A.R.T. Method.   Other Matters of the Heart: - Provides group verbal, written materials and models to describe Stable Angina and Peripheral Artery. Includes description of the disease process and treatment options available to the cardiac patient.   Exercise & Equipment Safety: - Individual verbal instruction and demonstration of equipment use and safety with use of the equipment.   Cardiac Rehab from 03/29/2018 in [facility] Cardiac and Pulmonary Rehab  Date  03/20/18  Educator  [facility]  Instruction Review Code  1- Verbalizes Understanding      Infection Prevention: - Provides verbal and written material to individual with discussion of infection control including proper hand washing and proper equipment cleaning during exercise session.   Cardiac Rehab from 03/29/2018 in [facility] Cardiac and Pulmonary Rehab  Date  03/20/18  Educator  [facility]  Instruction Review Code  1- Verbalizes Understanding      Falls Prevention: - Provides verbal and written material to individual with discussion of falls prevention and safety.   Cardiac Rehab from 03/29/2018 in [facility] Cardiac and Pulmonary Rehab  Date  03/20/18  Educator  [facility]  Instruction Review Code  1- Verbalizes Understanding      Diabetes: - Individual verbal and written instruction to review signs/symptoms of diabetes,  desired ranges of glucose level fasting, after meals and with exercise. Acknowledge that pre and post exercise glucose checks will be done for 3 sessions at entry of program.   Know Your Numbers and Risk Factors: -Group verbal and written instruction about important numbers in your health.  Discussion of what are risk factors and how they play a role in the disease process.  Review of Cholesterol, Blood Pressure, Diabetes, and BMI and the role they play in your overall health.   Sleep Hygiene: -Provides group verbal and written instruction about how sleep can affect your health.  Define sleep hygiene, discuss sleep cycles and impact of sleep habits. Review good sleep hygiene tips.    Other: -Provides group and verbal instruction on various topics (see comments)   Knowledge Questionnaire Score: Knowledge Questionnaire Score - 03/20/18 1414      Knowledge Questionnaire Score   Pre Score  23/26   correct answers reviewed with Lynann Bologna. Focus on Angina      Core Components/Risk Factors/Patient Goals at Admission: Personal Goals and Risk Factors at Admission - 03/20/18 1411  Core Components/Risk Factors/Patient Goals on Admission    Weight Management  Yes;Obesity;Weight Loss    Intervention  Weight Management: Develop a combined nutrition and exercise program designed to reach desired caloric intake, while maintaining appropriate intake of nutrient and fiber, sodium and fats, and appropriate energy expenditure required for the weight goal.;Weight Management: Provide education and appropriate resources to help participant work on and attain dietary goals.;Weight Management/Obesity: Establish reasonable short term and long term weight goals.;Obesity: Provide education and appropriate resources to help participant work on and attain dietary goals.    Admit Weight  334 lb 12.8 oz (151.9 kg)    Goal Weight: Short Term  330 lb (149.7 kg)    Goal Weight: Long Term  250 lb (113.4 kg)    Expected  Outcomes  Short Term: Continue to assess and modify interventions until short term weight is achieved;Long Term: Adherence to nutrition and physical activity/exercise program aimed toward attainment of established weight goal;Weight Loss: Understanding of general recommendations for a balanced deficit meal plan, which promotes 1-2 lb weight loss per week and includes a negative energy balance of [phone] kcal/d;Understanding recommendations for meals to include 15-35% energy as protein, 25-35% energy from fat, 35-60% energy from carbohydrates, less than '200mg'$  of dietary cholesterol, 20-35 gm of total fiber daily;Understanding of distribution of calorie intake throughout the day with the consumption of 4-5 meals/snacks    Heart Failure  Yes    Intervention  Provide a combined exercise and nutrition program that is supplemented with education, support and counseling about heart failure. Directed toward relieving symptoms such as shortness of breath, decreased exercise tolerance, and extremity edema.    Expected Outcomes  Improve functional capacity of life;Short term: Daily weights obtained and reported for increase. Utilizing diuretic protocols set by physician.;Short term: Attendance in program 2-3 days a week with increased exercise capacity. Reported lower sodium intake. Reported increased fruit and vegetable intake. Reports medication compliance.;Long term: Adoption of self-care skills and reduction of barriers for early signs and symptoms recognition and intervention leading to self-care maintenance.    Hypertension  Yes    Intervention  Provide education on lifestyle modifcations including regular physical activity/exercise, weight management, moderate sodium restriction and increased consumption of fresh fruit, vegetables, and low fat dairy, alcohol moderation, and smoking cessation.;Monitor prescription use compliance.    Expected Outcomes  Short Term: Continued assessment and intervention until BP is <  140/67m HG in hypertensive participants. < 130/85mHG in hypertensive participants with diabetes, heart failure or chronic kidney disease.;Long Term: Maintenance of blood pressure at goal levels.    Lipids  Yes    Intervention  Provide education and support for participant on nutrition & aerobic/resistive exercise along with prescribed medications to achieve LDL '70mg'$ , HDL >'40mg'$ .    Expected Outcomes  Short Term: Participant states understanding of desired cholesterol values and is compliant with medications prescribed. Participant is following exercise prescription and nutrition guidelines.;Long Term: Cholesterol controlled with medications as prescribed, with individualized exercise RX and with personalized nutrition plan. Value goals: LDL < '70mg'$ , HDL > 40 mg.       Core Components/Risk Factors/Patient Goals Review:  Goals and Risk Factor Review    Row Name 04/17/18 1039 05/03/18 1203           Core Components/Risk Factors/Patient Goals Review   Personal Goals Review  Weight Management/Obesity;Heart Failure  Weight Management/Obesity;Heart Failure      Review  Spoke with Morrill's wife today. Overall he is doing well.  There biggest  concern was that his weight is up some and his legs are still swelling.  He was encouraged to take an extra dose of Lasix to try to get the fluid off.  We also reviewed when to call the doctor about watching his weight and swelling.  They were encouraged to reach out to doctor if he continues to have swelling in his legs and if his weight is up more than 5lbs over the week or 3 lbs in a day.  Spoke with Daiwik's wife again today.  Overall he is doing well.  He still has issues with his weight and swelling. They are working to get it down.  He was encouraged to limit chair time to no more than two hours at a time and to get out and work more as able.  He was also encouraged to keep walking and ice knees afterwards.  His pressures have been good and they are happy with  them.        Expected Outcomes  Short: Try extra Lasix to help with weight and call doctor  Long: Continue to manage heart failure.   Short: Continue to work on weight loss. Long: Continue to monitor risk factors.          Core Components/Risk Factors/Patient Goals at Discharge (Final Review):  Goals and Risk Factor Review - 05/03/18 1203      Core Components/Risk Factors/Patient Goals Review   Personal Goals Review  Weight Management/Obesity;Heart Failure    Review  Spoke with Dameer's wife again today.  Overall he is doing well.  He still has issues with his weight and swelling. They are working to get it down.  He was encouraged to limit chair time to no more than two hours at a time and to get out and work more as able.  He was also encouraged to keep walking and ice knees afterwards.  His pressures have been good and they are happy with them.      Expected Outcomes  Short: Continue to work on weight loss. Long: Continue to monitor risk factors.        ITP Comments: ITP Comments    Row Name 03/20/18 1337 04/04/18 1147 04/12/18 1154 04/17/18 1037 05/03/18 1148   ITP Comments  Med Review completed. Initial ITP created. Diagnosis can be found in CE 2/17  Our program is currently closed due to COVID-19.  We are communicating with patient via phone calls and emails.    30 day review. Continue with ITP unless directed changes by Medical Director chart review.  Spoke with Maritza's wife on phone. She feels that he is doing well.   Spoke with Grayson's wife again.  Number listed is her work line.  He is doing well.    [place] Name 05/09/18 1135           ITP Comments  Pt will be discharged at this time.  He is now deceased.           Comments: Discharge ITP

## 2018-05-17 ENCOUNTER — Encounter: Payer: Self-pay | Admitting: *Deleted

## 2018-05-17 DIAGNOSIS — Z952 Presence of prosthetic heart valve: Secondary | ICD-10-CM

## 2018-05-17 NOTE — Progress Notes (Signed)
Cardiac Individual Treatment Plan  Patient Details  Name: Andre Erickson MRN: [id number] Date of Birth: 1955/09/21 Referring Provider:     Cardiac Rehab from 03/20/2018 in [facility] Cardiac and Pulmonary Rehab  Referring Provider  Shade Flood MD      Initial Encounter Date:    Cardiac Rehab from 03/20/2018 in [facility] Cardiac and Pulmonary Rehab  Date  03/20/18      Visit Diagnosis: S/P AVR (aortic valve replacement)  Patient's Home Medications on Admission:  Current Outpatient Medications:  .  albuterol (PROVENTIL HFA;VENTOLIN HFA) 108 (90 Base) MCG/ACT inhaler, Inhale 2 puffs into the lungs every 4 (four) hours as needed for wheezing or shortness of breath., Disp: , Rfl:  .  aspirin 81 MG tablet, Take 81 mg by mouth daily. , Disp: , Rfl:  .  chlorhexidine (PERIDEX) 0.12 % solution, Rinse with 15 mls twice daily for 30 seconds. Use after breakfast and at bedtime. Spit out excess. Do not swallow. (Patient not taking: Reported on 03/20/2018), Disp: 480 mL, Rfl: prn .  Cholecalciferol (VITAMIN D-3) 5000 units TABS, Take 5,000 Units by mouth daily., Disp: , Rfl:  .  diclofenac (VOLTAREN) 75 MG EC tablet, Take 75 mg by mouth daily. , Disp: , Rfl:  .  DULoxetine (CYMBALTA) 60 MG capsule, Take 60 mg by mouth daily., Disp: , Rfl:  .  furosemide (LASIX) 40 MG tablet, Take 1 tablet (40 mg total) by mouth 2 (two) times daily., Disp: 180 tablet, Rfl: 1 .  gabapentin (NEURONTIN) 300 MG capsule, Take 300 mg by mouth 2 (two) times daily as needed (pain). , Disp: , Rfl:  .  Ipratropium-Albuterol (COMBIVENT) 20-100 MCG/ACT AERS respimat, Inhale into the lungs., Disp: , Rfl:  .  potassium chloride SA (K-DUR,KLOR-CON) 20 MEQ tablet, Take 1 tablet (20 mEq total) by mouth daily., Disp: 90 tablet, Rfl: 1 .  pravastatin (PRAVACHOL) 40 MG tablet, Take 40 mg by mouth daily., Disp: , Rfl:   Past Medical History: Past Medical History:  Diagnosis Date  . Aortic valve stenosis   . Arthritis   . Asthma    as a  child  . GERD (gastroesophageal reflux disease)   . Hyperlipidemia   . Hypertension   . Morbid obesity ([place])   . Murmur   . Sleep apnea    trying to use the CPAP    Tobacco Use: Social History   Tobacco Use  Smoking Status Former Smoker  . Last attempt to quit: 09/27/1978  . Years since quitting: 39.6  Smokeless Tobacco Never Used    Labs: Recent Review Flowsheet Data    There is no flowsheet data to display.       Exercise Target Goals: Exercise Program Goal: Individual exercise prescription set using results from initial 6 min walk test and THRR while considering  patient's activity barriers and safety.   Exercise Prescription Goal: Initial exercise prescription builds to 30-45 minutes a day of aerobic activity, 2-3 days per week.  Home exercise guidelines will be given to patient during program as part of exercise prescription that the participant will acknowledge.  Activity Barriers & Risk Stratification: Activity Barriers & Cardiac Risk Stratification - 03/20/18 1423      Activity Barriers & Cardiac Risk Stratification   Activity Barriers  Deconditioning;Muscular Weakness;Shortness of Breath;Balance Concerns;Assistive Device;Decreased Ventricular Function;Joint Problems;Arthritis   R knee pain, Arth in knees and shoulders, R Frozen Shoulder (limited ROM)   Cardiac Risk Stratification  High       6  Minute Walk: 6 Minute Walk    Row Name 03/20/18 1421         6 Minute Walk   Phase  Initial     Distance  933 feet walks with cane, limps due to R knee     Walk Time  6 minutes     # of Rest Breaks  0     MPH  1.77     METS  2.34     RPE  11     VO2 Peak  8.17     Symptoms  No     Resting HR  84 bpm     Resting BP  132/70     Resting Oxygen Saturation   97 %     Exercise Oxygen Saturation  during 6 min walk  89 %     Max Ex. HR  84 bpm     Max Ex. BP  132/70     2 Minute Post BP  142/72        Oxygen Initial Assessment:   Oxygen  Re-Evaluation:   Oxygen Discharge (Final Oxygen Re-Evaluation):   Initial Exercise Prescription: Initial Exercise Prescription - 03/20/18 1400      Date of Initial Exercise RX and Referring Provider   Date  03/20/18    Referring Provider  Kiefer, Todd MD      Recumbant Elliptical   Level  1    RPM  50    Minutes  15    METs  2.3      T5 Nustep   Level  1    SPM  80    Minutes  15    METs  2.3      Track   Laps  27    Minutes  15    METs  2.23      Prescription Details   Frequency (times per week)  3    Duration  Progress to 45 minutes of aerobic exercise without signs/symptoms of physical distress      Intensity   THRR 40-80% of Max Heartrate  114-144    Ratings of Perceived Exertion  11-13    Perceived Dyspnea  0-4      Progression   Progression  Continue to progress workloads to maintain intensity without signs/symptoms of physical distress.      Resistance Training   Training Prescription  Yes    Weight  3 lbs    Reps  10-15       Perform Capillary Blood Glucose checks as needed.  Exercise Prescription Changes: Exercise Prescription Changes    Row Name 03/20/18 1400 04/04/18 1300           Response to Exercise   Blood Pressure (Admit)  132/70  140/76      Blood Pressure (Exercise)  192/82  132/76      Blood Pressure (Exit)  142/72  128/86      Heart Rate (Admit)  84 bpm  112 bpm      Heart Rate (Exercise)  132 bpm  124 bpm      Heart Rate (Exit)  94 bpm  96 bpm      Oxygen Saturation (Admit)  97 %  -      Oxygen Saturation (Exercise)  89 %  -      Oxygen Saturation (Exit)  95 %  -      Rating of Perceived Exertion (Exercise)  11  13        Symptoms  walking with cane and limp due to R knee  -      Comments  walk test results  second full day of exercise      Duration  -  Progress to 45 minutes of aerobic exercise without signs/symptoms of physical distress      Intensity  -  THRR unchanged        Progression   Progression  -  Continue to  progress workloads to maintain intensity without signs/symptoms of physical distress.      Average METs  -  1.63        Resistance Training   Training Prescription  -  Yes      Weight  -  3 lbs      Reps  -  10-15        Interval Training   Interval Training  -  No        Treadmill   MPH  -  0.9      Grade  -  0      Minutes  -  15      METs  -  1.7        Recumbant Elliptical   Level  -  1      Minutes  -  15      METs  -  1.3        T5 Nustep   Level  -  1      Minutes  -  15      METs  -  1.9        Home Exercise Plan   Plans to continue exercise at  -  Home (comment) walking      Frequency  -  Add 2 additional days to program exercise sessions.      Initial Home Exercises Provided  -  04/04/18 sent via mail         Exercise Comments: Exercise Comments    Row Name 03/29/18 0731           Exercise Comments  First full day of exercise!  Patient was oriented to gym and equipment including functions, settings, policies, and procedures.  Patient's individual exercise prescription and treatment plan were reviewed.  All starting workloads were established based on the results of the 6 minute walk test done at initial orientation visit.  The plan for exercise progression was also introduced and progression will be customized based on patient's performance and goals.          Exercise Goals and Review: Exercise Goals    Row Name 03/20/18 1433             Exercise Goals   Increase Physical Activity  Yes       Intervention  Provide advice, education, support and counseling about physical activity/exercise needs.;Develop an individualized exercise prescription for aerobic and resistive training based on initial evaluation findings, risk stratification, comorbidities and participant's personal goals.       Expected Outcomes  Short Term: Attend rehab on a regular basis to increase amount of physical activity.;Long Term: Add in home exercise to make exercise part of  routine and to increase amount of physical activity.;Long Term: Exercising regularly at least 3-5 days a week.       Increase Strength and Stamina  Yes       Intervention  Provide advice, education, support and counseling about physical activity/exercise needs.;Develop an individualized exercise prescription for aerobic and resistive  training based on initial evaluation findings, risk stratification, comorbidities and participant's personal goals.       Expected Outcomes  Long Term: Improve cardiorespiratory fitness, muscular endurance and strength as measured by increased METs and functional capacity (6MWT);Short Term: Perform resistance training exercises routinely during rehab and add in resistance training at home;Short Term: Increase workloads from initial exercise prescription for resistance, speed, and METs.       Able to understand and use rate of perceived exertion (RPE) scale  Yes       Intervention  Provide education and explanation on how to use RPE scale       Expected Outcomes  Short Term: Able to use RPE daily in rehab to express subjective intensity level;Long Term:  Able to use RPE to guide intensity level when exercising independently       Knowledge and understanding of Target Heart Rate Range (THRR)  Yes       Intervention  Provide education and explanation of THRR including how the numbers were predicted and where they are located for reference       Expected Outcomes  Short Term: Able to state/look up THRR;Long Term: Able to use THRR to govern intensity when exercising independently;Short Term: Able to use daily as guideline for intensity in rehab       Able to check pulse independently  Yes       Intervention  Provide education and demonstration on how to check pulse in carotid and radial arteries.;Review the importance of being able to check your own pulse for safety during independent exercise       Expected Outcomes  Short Term: Able to explain why pulse checking is important  during independent exercise;Long Term: Able to check pulse independently and accurately       Understanding of Exercise Prescription  Yes       Intervention  Provide education, explanation, and written materials on patient's individual exercise prescription       Expected Outcomes  Short Term: Able to explain program exercise prescription;Long Term: Able to explain home exercise prescription to exercise independently          Exercise Goals Re-Evaluation : Exercise Goals Re-Evaluation    Row Name 03/29/18 0731 04/04/18 1147 04/06/18 1321 04/17/18 1038 05/03/18 1150     Exercise Goal Re-Evaluation   Exercise Goals Review  Able to understand and use rate of perceived exertion (RPE) scale;Knowledge and understanding of Target Heart Rate Range (THRR);Understanding of Exercise Prescription  Increase Physical Activity;Increase Strength and Stamina;Understanding of Exercise Prescription  Increase Physical Activity;Increase Strength and Stamina;Understanding of Exercise Prescription  Increase Physical Activity;Increase Strength and Stamina;Understanding of Exercise Prescription  Increase Physical Activity;Increase Strength and Stamina;Understanding of Exercise Prescription   Comments  Reviewed RPE scale, THR and program prescription with pt today.  Pt voiced understanding and was given a copy of goals to take home.   Dailan has completed two full days of exercise.  Since we are currently closed, we have sent out his home exercise guidelines in the mail and will be doing follow up phone calls.    Called to review home exercise with Rollan over the phone.   Reviewed his target heart rate with him.  He has been walking some but hasn't been out today since it has been raining off and on.  Encouraged him to use our videos for the chair exercises and to repeat it twice to get his full 30 min.   Spoke with Oluwadamilare's wife.  They did get their packet and he is getting in some exercise.  He is not doing everything but some  things. He is trying to stay active by working around the house and out in the yard.    Spoke with Tommaso's wife.  He continues to walk some and getting outside.  He does spend a lot of time in his reclinder and was encouraged to limit to no more than two hours at a time.  We also talked about using the chair exercise videos to keep him active.  She noted that Raeford has been having problems with his knees when walking. We talked about using ice after walking and using his walker for more support and helping his knee.  She will continue to work with him on his walking.    Expected Outcomes  Short: Use RPE daily to regulate intensity. Long: Follow program prescription in THR.  Short: Start to walk and follow videos at home.  Long: Continue to increase strength and stamina.   Short: Use chair exercises and walk at home.  Long: Continue to build strength and stamina.   Short: Walk more.  Long: Continue to build up activity levels.   Short: Continue to walk.  Long: Continue to build up activity.       Discharge Exercise Prescription (Final Exercise Prescription Changes): Exercise Prescription Changes - 04/04/18 1300      Response to Exercise   Blood Pressure (Admit)  140/76    Blood Pressure (Exercise)  132/76    Blood Pressure (Exit)  128/86    Heart Rate (Admit)  112 bpm    Heart Rate (Exercise)  124 bpm    Heart Rate (Exit)  96 bpm    Rating of Perceived Exertion (Exercise)  13    Comments  second full day of exercise    Duration  Progress to 45 minutes of aerobic exercise without signs/symptoms of physical distress    Intensity  THRR unchanged      Progression   Progression  Continue to progress workloads to maintain intensity without signs/symptoms of physical distress.    Average METs  1.63      Resistance Training   Training Prescription  Yes    Weight  3 lbs    Reps  10-15      Interval Training   Interval Training  No      Treadmill   MPH  0.9    Grade  0    Minutes  15    METs   1.7      Recumbant Elliptical   Level  1    Minutes  15    METs  1.3      T5 Nustep   Level  1    Minutes  15    METs  1.9      Home Exercise Plan   Plans to continue exercise at  Home (comment)   walking   Frequency  Add 2 additional days to program exercise sessions.    Initial Home Exercises Provided  04/04/18   sent via mail      Nutrition:  Target Goals: Understanding of nutrition guidelines, daily intake of sodium '1500mg'$ , cholesterol '200mg'$ , calories 30% from fat and 7% or less from saturated fats, daily to have 5 or more servings of fruits and vegetables.  Biometrics: Pre Biometrics - 03/20/18 1434      Pre Biometrics   Height  5' 8.9" (1.75 m)    Weight  Marland Kitchen)  334 lb 14.4 oz (151.9 kg)    Waist Circumference  52 inches    Hip Circumference  58 inches    Waist to Hip Ratio  0.9 %    BMI (Calculated)  49.6    Single Leg Stand  334.9 seconds        Nutrition Therapy Plan and Nutrition Goals: Nutrition Therapy & Goals - 03/20/18 1420      Intervention Plan   Intervention  Prescribe, educate and counsel regarding individualized specific dietary modifications aiming towards targeted core components such as weight, hypertension, lipid management, diabetes, heart failure and other comorbidities.;Nutrition handout(s) given to patient.    Expected Outcomes  Short Term Goal: Understand basic principles of dietary content, such as calories, fat, sodium, cholesterol and nutrients.;Short Term Goal: A plan has been developed with personal nutrition goals set during dietitian appointment.;Long Term Goal: Adherence to prescribed nutrition plan.       Nutrition Assessments: Nutrition Assessments - 03/20/18 1420      MEDFICTS Scores   Pre Score  41       Nutrition Goals Re-Evaluation:   Nutrition Goals Discharge (Final Nutrition Goals Re-Evaluation):   Psychosocial: Target Goals: Acknowledge presence or absence of significant depression and/or stress, maximize  coping skills, provide positive support system. Participant is able to verbalize types and ability to use techniques and skills needed for reducing stress and depression.   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 His incision is currently still healing, and his MD said no caring for the animals until it is completely healed. 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 He does notice a huge improvement in his SOB, energy, and sleep habits since getting his valve replacement      Family Dynamics   Good Support System?  Yes   wife     Barriers   Psychosocial barriers to participate in program  There are no identifiable barriers or psychosocial needs.;The patient should benefit from training in stress management and relaxation.      Screening Interventions   Interventions  Encouraged to exercise;Program counselor consult;Provide feedback about the scores to participant;To provide support and resources with identified psychosocial needs    Expected Outcomes  Short Term goal: Utilizing psychosocial counselor, staff and physician to assist with identification of specific Stressors or current issues interfering with healing process. Setting desired goal for each stressor or current issue identified.;Long Term Goal: Stressors or current issues are controlled or eliminated.;Short Term goal: Identification and review with participant of any Quality of Life or Depression concerns found by scoring the questionnaire.;Long Term goal: The participant improves quality of Life and PHQ9 Scores as seen by post scores and/or verbalization of changes       Quality  of Life Scores:  Quality of Life - 03/20/18 1420      Quality of Life   Select  Quality of Life      Quality of Life Scores   Health/Function Pre  21.17 %    Socioeconomic Pre  22.92 %    Psych/Spiritual Pre  23.93 %    Family Pre  22.8 %  GLOBAL Pre  22.32 %      Scores of 19 and below usually indicate a poorer quality of life in these areas.  A difference of  2-3 points is a clinically meaningful difference.  A difference of 2-3 points in the total score of the Quality of Life Index has been associated with significant improvement in overall quality of life, self-image, physical symptoms, and general health in studies assessing change in quality of life.  PHQ-9: Recent Review Flowsheet Data    Depression screen [facility] 2/9 03/20/2018   Decreased Interest 1   Down, Depressed, Hopeless 1   PHQ - 2 Score 2   Altered sleeping 0   Tired, decreased energy 1   Change in appetite 0   Feeling bad or failure about yourself  0   Trouble concentrating 0   Moving slowly or fidgety/restless 0   Suicidal thoughts 0   PHQ-9 Score 3   Difficult doing work/chores Somewhat difficult     Interpretation of Total Score  Total Score Depression Severity:  1-4 = Minimal depression, 5-9 = Mild depression, 10-14 = Moderate depression, 15-19 = Moderately severe depression, 20-27 = Severe depression   Psychosocial Evaluation and Intervention: Psychosocial Evaluation - 04/05/18 1623      Psychosocial Evaluation & Interventions   Interventions  Encouraged to exercise with the program and follow exercise prescription    Comments  counselor Learta Codding documenting in [facility] session: counselor called patient to conduct initial assessment.  Patient reported that his doctor had referred him to the program as it would be beneficial to his general health.  Patient listed his spouse Benjamine Mola and their church family as primary supports.  He did mention that he has degenerative arthritis and has 'painful knees.'   No concerns reported with sleeping or eating.  His stressors include having numbness in his left leg and his mind wanting to do things that his body can't yet manage.    Expected Outcomes  Short term goals: increase energy and stamina, exercise more; long term goals: to be able to resume yardwork and caring for rabbits    Continue Psychosocial Services   Follow up required by staff       Psychosocial Re-Evaluation: Psychosocial Re-Evaluation    [place] Name 05/03/18 1157             Psychosocial Re-Evaluation   Current issues with  Current Stress Concerns       Comments  Nyheem is doing okay at home. He spends more time in his chair then he should.  They are doing okay on the famr.  They have one of their animals with a small  hairline fracture that is taking a lot of their attention.  Bradden also has continued to complain about having some chest pain post surgery.  I have encouraged them to contact the doctor about this to make sure it's just healing pain and not blockages.  She said they would.        Expected Outcomes  Short: Call doctor about pain.  Long: Continue to care for animals.        Interventions  Encouraged to attend Cardiac Rehabilitation for the exercise       Continue Psychosocial Services   Follow up required by staff          Psychosocial Discharge (Final Psychosocial Re-Evaluation): Psychosocial Re-Evaluation - 05/03/18 1157      Psychosocial Re-Evaluation   Current issues with  Current Stress Concerns  Comments  Ladarren is doing okay at home. He spends more time in his chair then he should.  They are doing okay on the famr.  They have one of their animals with a small  hairline fracture that is taking a lot of their attention.  Fitzroy also has continued to complain about having some chest pain post surgery.  I have encouraged them to contact the doctor about this to make sure it's just healing pain and not blockages.  She said they would.     Expected Outcomes  Short: Call  doctor about pain.  Long: Continue to care for animals.     Interventions  Encouraged to attend Cardiac Rehabilitation for the exercise    Continue Psychosocial Services   Follow up required by staff       Vocational Rehabilitation: Provide vocational rehab assistance to qualifying candidates.   Vocational Rehab Evaluation & Intervention: Vocational Rehab - 03/20/18 1415      Initial Vocational Rehab Evaluation & Intervention   Assessment shows need for Vocational Rehabilitation  No       Education: Education Goals: Education classes will be provided on a variety of topics geared toward better understanding of heart health and risk factor modification. Participant will state understanding/return demonstration of topics presented as noted by education test scores.  Learning Barriers/Preferences: Learning Barriers/Preferences - 03/20/18 1412      Learning Barriers/Preferences   Learning Barriers  Exercise Concerns    Learning Preferences  Group Instruction;Individual Instruction;Verbal Instruction;Skilled Demonstration;Written Material;Video       Education Topics:  AED/CPR: - Group verbal and written instruction with the use of models to demonstrate the basic use of the AED with the basic ABC's of resuscitation.   General Nutrition Guidelines/Fats and Fiber: -Group instruction provided by verbal, written material, models and posters to present the general guidelines for heart healthy nutrition. Gives an explanation and review of dietary fats and fiber.   Controlling Sodium/Reading Food Labels: -Group verbal and written material supporting the discussion of sodium use in heart healthy nutrition. Review and explanation with models, verbal and written materials for utilization of the food label.   Cardiac Rehab from 03/29/2018 in [facility] Cardiac and Pulmonary Rehab  Date  03/29/18  Educator  [facility]  Instruction Review Code  1- Verbalizes Understanding      Exercise Physiology &  General Exercise Guidelines: - Group verbal and written instruction with models to review the exercise physiology of the cardiovascular system and associated critical values. Provides general exercise guidelines with specific guidelines to those with heart or lung disease.    Aerobic Exercise & Resistance Training: - Gives group verbal and written instruction on the various components of exercise. Focuses on aerobic and resistive training programs and the benefits of this training and how to safely progress through these programs..   Flexibility, Balance, Mind/Body Relaxation: Provides group verbal/written instruction on the benefits of flexibility and balance training, including mind/body exercise modes such as yoga, pilates and tai chi.  Demonstration and skill practice provided.   Stress and Anxiety: - Provides group verbal and written instruction about the health risks of elevated stress and causes of high stress.  Discuss the correlation between heart/lung disease and anxiety and treatment options. Review healthy ways to manage with stress and anxiety.   Depression: - Provides group verbal and written instruction on the correlation between heart/lung disease and depressed mood, treatment options, and the stigmas associated with seeking treatment.   Anatomy & Physiology of the Heart: -  Group verbal and written instruction and models provide basic cardiac anatomy and physiology, with the coronary electrical and arterial systems. Review of Valvular disease and Heart Failure   Cardiac Procedures: - Group verbal and written instruction to review commonly prescribed medications for heart disease. Reviews the medication, class of the drug, and side effects. Includes the steps to properly store meds and maintain the prescription regimen. (beta blockers and nitrates)   Cardiac Medications I: - Group verbal and written instruction to review commonly prescribed medications for heart disease.  Reviews the medication, class of the drug, and side effects. Includes the steps to properly store meds and maintain the prescription regimen.   Cardiac Medications II: -Group verbal and written instruction to review commonly prescribed medications for heart disease. Reviews the medication, class of the drug, and side effects. (all other drug classes)    Go Sex-Intimacy & Heart Disease, Get SMART - Goal Setting: - Group verbal and written instruction through game format to discuss heart disease and the return to sexual intimacy. Provides group verbal and written material to discuss and apply goal setting through the application of the S.M.A.R.T. Method.   Other Matters of the Heart: - Provides group verbal, written materials and models to describe Stable Angina and Peripheral Artery. Includes description of the disease process and treatment options available to the cardiac patient.   Exercise & Equipment Safety: - Individual verbal instruction and demonstration of equipment use and safety with use of the equipment.   Cardiac Rehab from 03/29/2018 in [facility] Cardiac and Pulmonary Rehab  Date  03/20/18  Educator  [facility]  Instruction Review Code  1- Verbalizes Understanding      Infection Prevention: - Provides verbal and written material to individual with discussion of infection control including proper hand washing and proper equipment cleaning during exercise session.   Cardiac Rehab from 03/29/2018 in [facility] Cardiac and Pulmonary Rehab  Date  03/20/18  Educator  [facility]  Instruction Review Code  1- Verbalizes Understanding      Falls Prevention: - Provides verbal and written material to individual with discussion of falls prevention and safety.   Cardiac Rehab from 03/29/2018 in [facility] Cardiac and Pulmonary Rehab  Date  03/20/18  Educator  [facility]  Instruction Review Code  1- Verbalizes Understanding      Diabetes: - Individual verbal and written instruction to review signs/symptoms of diabetes,  desired ranges of glucose level fasting, after meals and with exercise. Acknowledge that pre and post exercise glucose checks will be done for 3 sessions at entry of program.   Know Your Numbers and Risk Factors: -Group verbal and written instruction about important numbers in your health.  Discussion of what are risk factors and how they play a role in the disease process.  Review of Cholesterol, Blood Pressure, Diabetes, and BMI and the role they play in your overall health.   Sleep Hygiene: -Provides group verbal and written instruction about how sleep can affect your health.  Define sleep hygiene, discuss sleep cycles and impact of sleep habits. Review good sleep hygiene tips.    Other: -Provides group and verbal instruction on various topics (see comments)   Knowledge Questionnaire Score: Knowledge Questionnaire Score - 03/20/18 1414      Knowledge Questionnaire Score   Pre Score  23/26   correct answers reviewed with Lynann Bologna. Focus on Angina      Core Components/Risk Factors/Patient Goals at Admission: Personal Goals and Risk Factors at Admission - 03/20/18 1411  Core Components/Risk Factors/Patient Goals on Admission    Weight Management  Yes;Obesity;Weight Loss    Intervention  Weight Management: Develop a combined nutrition and exercise program designed to reach desired caloric intake, while maintaining appropriate intake of nutrient and fiber, sodium and fats, and appropriate energy expenditure required for the weight goal.;Weight Management: Provide education and appropriate resources to help participant work on and attain dietary goals.;Weight Management/Obesity: Establish reasonable short term and long term weight goals.;Obesity: Provide education and appropriate resources to help participant work on and attain dietary goals.    Admit Weight  334 lb 12.8 oz (151.9 kg)    Goal Weight: Short Term  330 lb (149.7 kg)    Goal Weight: Long Term  250 lb (113.4 kg)    Expected  Outcomes  Short Term: Continue to assess and modify interventions until short term weight is achieved;Long Term: Adherence to nutrition and physical activity/exercise program aimed toward attainment of established weight goal;Weight Loss: Understanding of general recommendations for a balanced deficit meal plan, which promotes 1-2 lb weight loss per week and includes a negative energy balance of [id number] kcal/d;Understanding recommendations for meals to include 15-35% energy as protein, 25-35% energy from fat, 35-60% energy from carbohydrates, less than '200mg'$  of dietary cholesterol, 20-35 gm of total fiber daily;Understanding of distribution of calorie intake throughout the day with the consumption of 4-5 meals/snacks    Heart Failure  Yes    Intervention  Provide a combined exercise and nutrition program that is supplemented with education, support and counseling about heart failure. Directed toward relieving symptoms such as shortness of breath, decreased exercise tolerance, and extremity edema.    Expected Outcomes  Improve functional capacity of life;Short term: Daily weights obtained and reported for increase. Utilizing diuretic protocols set by physician.;Short term: Attendance in program 2-3 days a week with increased exercise capacity. Reported lower sodium intake. Reported increased fruit and vegetable intake. Reports medication compliance.;Long term: Adoption of self-care skills and reduction of barriers for early signs and symptoms recognition and intervention leading to self-care maintenance.    Hypertension  Yes    Intervention  Provide education on lifestyle modifcations including regular physical activity/exercise, weight management, moderate sodium restriction and increased consumption of fresh fruit, vegetables, and low fat dairy, alcohol moderation, and smoking cessation.;Monitor prescription use compliance.    Expected Outcomes  Short Term: Continued assessment and intervention until BP is <  140/70m HG in hypertensive participants. < 130/831mHG in hypertensive participants with diabetes, heart failure or chronic kidney disease.;Long Term: Maintenance of blood pressure at goal levels.    Lipids  Yes    Intervention  Provide education and support for participant on nutrition & aerobic/resistive exercise along with prescribed medications to achieve LDL '70mg'$ , HDL >'40mg'$ .    Expected Outcomes  Short Term: Participant states understanding of desired cholesterol values and is compliant with medications prescribed. Participant is following exercise prescription and nutrition guidelines.;Long Term: Cholesterol controlled with medications as prescribed, with individualized exercise RX and with personalized nutrition plan. Value goals: LDL < '70mg'$ , HDL > 40 mg.       Core Components/Risk Factors/Patient Goals Review:  Goals and Risk Factor Review    Row Name 04/17/18 1039 05/03/18 1203           Core Components/Risk Factors/Patient Goals Review   Personal Goals Review  Weight Management/Obesity;Heart Failure  Weight Management/Obesity;Heart Failure      Review  Spoke with Leonte's wife today. Overall he is doing well.  There biggest  concern was that his weight is up some and his legs are still swelling.  He was encouraged to take an extra dose of Lasix to try to get the fluid off.  We also reviewed when to call the doctor about watching his weight and swelling.  They were encouraged to reach out to doctor if he continues to have swelling in his legs and if his weight is up more than 5lbs over the week or 3 lbs in a day.  Spoke with Kamen's wife again today.  Overall he is doing well.  He still has issues with his weight and swelling. They are working to get it down.  He was encouraged to limit chair time to no more than two hours at a time and to get out and work more as able.  He was also encouraged to keep walking and ice knees afterwards.  His pressures have been good and they are happy with  them.        Expected Outcomes  Short: Try extra Lasix to help with weight and call doctor  Long: Continue to manage heart failure.   Short: Continue to work on weight loss. Long: Continue to monitor risk factors.          Core Components/Risk Factors/Patient Goals at Discharge (Final Review):  Goals and Risk Factor Review - 05/03/18 1203      Core Components/Risk Factors/Patient Goals Review   Personal Goals Review  Weight Management/Obesity;Heart Failure    Review  Spoke with Miklos's wife again today.  Overall he is doing well.  He still has issues with his weight and swelling. They are working to get it down.  He was encouraged to limit chair time to no more than two hours at a time and to get out and work more as able.  He was also encouraged to keep walking and ice knees afterwards.  His pressures have been good and they are happy with them.      Expected Outcomes  Short: Continue to work on weight loss. Long: Continue to monitor risk factors.        ITP Comments: ITP Comments    Row Name 03/20/18 1337 04/04/18 1147 04/12/18 1154 04/17/18 1037 05/03/18 1148   ITP Comments  Med Review completed. Initial ITP created. Diagnosis can be found in CE 2/17  Our program is currently closed due to COVID-19.  We are communicating with patient via phone calls and emails.    30 day review. Continue with ITP unless directed changes by Medical Director chart review.  Spoke with Jameal's wife on phone. She feels that he is doing well.   Spoke with Oluwadamilola's wife again.  Number listed is her work line.  He is doing well.    [place] Name 05/09/18 1135 05/17/18 1005         ITP Comments  Pt will be discharged at this time.  He is now deceased.   Pt called it was his father that had passed away.  He is now reinstated in the program and would like to continue to participate!!         Comments: Restart ITP.  It was the pt's father that had passed.

## 2018-05-26 ENCOUNTER — Encounter: Payer: Self-pay | Admitting: *Deleted

## 2018-05-26 DIAGNOSIS — Z952 Presence of prosthetic heart valve: Secondary | ICD-10-CM

## 2018-05-31 NOTE — Progress Notes (Signed)
Counselor followed up with patient per staff request based on recent death of patient's father.  Patient spoke of inability to have in-person funeral service for deceased due to COVID 19 protocols, hopes to be able to have a service at a later date.  Patient reported that he feels his breathing when walking has improved which is a positive for him.  Patient open to counselor following up next month.

## 2018-06-28 NOTE — Progress Notes (Signed)
Counselor attempted to contact patient.  Message left, encouraging patient to call back as needed.  Contact lies with patient.

## 2018-07-13 ENCOUNTER — Telehealth: Payer: Self-pay | Admitting: *Deleted

## 2018-07-13 NOTE — Telephone Encounter (Signed)
Attempted to contact pt for follow up.  Unable to leave message on home number.  Last attempt was 5/28.  Will send email as well.

## 2018-07-15 ENCOUNTER — Encounter: Payer: Self-pay | Admitting: *Deleted

## 2018-07-15 DIAGNOSIS — Z952 Presence of prosthetic heart valve: Secondary | ICD-10-CM

## 2018-08-02 ENCOUNTER — Encounter: Payer: Self-pay | Admitting: *Deleted

## 2018-08-02 DIAGNOSIS — Z952 Presence of prosthetic heart valve: Secondary | ICD-10-CM

## 2018-08-02 NOTE — Progress Notes (Signed)
Cardiac Individual Treatment Plan  Patient Details  Name: Andre Erickson MRN: [id number] Date of Birth: 1955/11/25 Referring Provider:     Cardiac Rehab from 03/20/2018 in [facility] Cardiac and Pulmonary Rehab  Referring Provider  Shade Flood MD      Initial Encounter Date:    Cardiac Rehab from 03/20/2018 in [facility] Cardiac and Pulmonary Rehab  Date  03/20/18      Visit Diagnosis: S/P AVR (aortic valve replacement)  Patient's Home Medications on Admission:  Current Outpatient Medications:  .  albuterol (PROVENTIL HFA;VENTOLIN HFA) 108 (90 Base) MCG/ACT inhaler, Inhale 2 puffs into the lungs every 4 (four) hours as needed for wheezing or shortness of breath., Disp: , Rfl:  .  aspirin 81 MG tablet, Take 81 mg by mouth daily. , Disp: , Rfl:  .  chlorhexidine (PERIDEX) 0.12 % solution, Rinse with 15 mls twice daily for 30 seconds. Use after breakfast and at bedtime. Spit out excess. Do not swallow. (Patient not taking: Reported on 03/20/2018), Disp: 480 mL, Rfl: prn .  Cholecalciferol (VITAMIN D-3) 5000 units TABS, Take 5,000 Units by mouth daily., Disp: , Rfl:  .  diclofenac (VOLTAREN) 75 MG EC tablet, Take 75 mg by mouth daily. , Disp: , Rfl:  .  DULoxetine (CYMBALTA) 60 MG capsule, Take 60 mg by mouth daily., Disp: , Rfl:  .  furosemide (LASIX) 40 MG tablet, Take 1 tablet (40 mg total) by mouth 2 (two) times daily., Disp: 180 tablet, Rfl: 1 .  gabapentin (NEURONTIN) 300 MG capsule, Take 300 mg by mouth 2 (two) times daily as needed (pain). , Disp: , Rfl:  .  Ipratropium-Albuterol (COMBIVENT) 20-100 MCG/ACT AERS respimat, Inhale into the lungs., Disp: , Rfl:  .  potassium chloride SA (K-DUR,KLOR-CON) 20 MEQ tablet, Take 1 tablet (20 mEq total) by mouth daily., Disp: 90 tablet, Rfl: 1 .  pravastatin (PRAVACHOL) 40 MG tablet, Take 40 mg by mouth daily., Disp: , Rfl:   Past Medical History: Past Medical History:  Diagnosis Date  . Aortic valve stenosis   . Arthritis   . Asthma    as a  child  . GERD (gastroesophageal reflux disease)   . Hyperlipidemia   . Hypertension   . Morbid obesity ([place])   . Murmur   . Sleep apnea    trying to use the CPAP    Tobacco Use: Social History   Tobacco Use  Smoking Status Former Smoker  . Quit date: 09/27/1978  . Years since quitting: 39.8  Smokeless Tobacco Never Used    Labs: Recent Review Flowsheet Data    There is no flowsheet data to display.       Exercise Target Goals: Exercise Program Goal: Individual exercise prescription set using results from initial 6 min walk test and THRR while considering  patient's activity barriers and safety.   Exercise Prescription Goal: Initial exercise prescription builds to 30-45 minutes a day of aerobic activity, 2-3 days per week.  Home exercise guidelines will be given to patient during program as part of exercise prescription that the participant will acknowledge.  Activity Barriers & Risk Stratification: Activity Barriers & Cardiac Risk Stratification - 03/20/18 1423      Activity Barriers & Cardiac Risk Stratification   Activity Barriers  Deconditioning;Muscular Weakness;Shortness of Breath;Balance Concerns;Assistive Device;Decreased Ventricular Function;Joint Problems;Arthritis   R knee pain, Arth in knees and shoulders, R Frozen Shoulder (limited ROM)   Cardiac Risk Stratification  High       6 Minute Walk:  6 Minute Walk    Row Name 03/20/18 1421         6 Minute Walk   Phase  Initial     Distance  933 feet walks with cane, limps due to R knee     Walk Time  6 minutes     # of Rest Breaks  0     MPH  1.77     METS  2.34     RPE  11     VO2 Peak  8.17     Symptoms  No     Resting HR  84 bpm     Resting BP  132/70     Resting Oxygen Saturation   97 %     Exercise Oxygen Saturation  during 6 min walk  89 %     Max Ex. HR  84 bpm     Max Ex. BP  132/70     2 Minute Post BP  142/72        Oxygen Initial Assessment:   Oxygen Re-Evaluation:   Oxygen  Discharge (Final Oxygen Re-Evaluation):   Initial Exercise Prescription: Initial Exercise Prescription - 03/20/18 1400      Date of Initial Exercise RX and Referring Provider   Date  03/20/18    Referring Provider  Shade Flood MD      Recumbant Elliptical   Level  1    RPM  50    Minutes  15    METs  2.3      T5 Nustep   Level  1    SPM  80    Minutes  15    METs  2.3      Track   Laps  27    Minutes  15    METs  2.23      Prescription Details   Frequency (times per week)  3    Duration  Progress to 45 minutes of aerobic exercise without signs/symptoms of physical distress      Intensity   THRR 40-80% of Max Heartrate  114-144    Ratings of Perceived Exertion  11-13    Perceived Dyspnea  0-4      Progression   Progression  Continue to progress workloads to maintain intensity without signs/symptoms of physical distress.      Resistance Training   Training Prescription  Yes    Weight  3 lbs    Reps  10-15       Perform Capillary Blood Glucose checks as needed.  Exercise Prescription Changes: Exercise Prescription Changes    Row Name 03/20/18 1400 04/04/18 1300           Response to Exercise   Blood Pressure (Admit)  132/70  140/76      Blood Pressure (Exercise)  192/82  132/76      Blood Pressure (Exit)  142/72  128/86      Heart Rate (Admit)  84 bpm  112 bpm      Heart Rate (Exercise)  132 bpm  124 bpm      Heart Rate (Exit)  94 bpm  96 bpm      Oxygen Saturation (Admit)  97 %  -      Oxygen Saturation (Exercise)  89 %  -      Oxygen Saturation (Exit)  95 %  -      Rating of Perceived Exertion (Exercise)  11  13      Symptoms  walking with cane and limp due to R knee  -      Comments  walk test results  second full day of exercise      Duration  -  Progress to 45 minutes of aerobic exercise without signs/symptoms of physical distress      Intensity  -  THRR unchanged        Progression   Progression  -  Continue to progress workloads to maintain  intensity without signs/symptoms of physical distress.      Average METs  -  1.63        Resistance Training   Training Prescription  -  Yes      Weight  -  3 lbs      Reps  -  10-15        Interval Training   Interval Training  -  No        Treadmill   MPH  -  0.9      Grade  -  0      Minutes  -  15      METs  -  1.7        Recumbant Elliptical   Level  -  1      Minutes  -  15      METs  -  1.3        T5 Nustep   Level  -  1      Minutes  -  15      METs  -  1.9        Home Exercise Plan   Plans to continue exercise at  -  Home (comment) walking      Frequency  -  Add 2 additional days to program exercise sessions.      Initial Home Exercises Provided  -  04/04/18 sent via mail         Exercise Comments: Exercise Comments    Row Name 03/29/18 0731           Exercise Comments  First full day of exercise!  Patient was oriented to gym and equipment including functions, settings, policies, and procedures.  Patient's individual exercise prescription and treatment plan were reviewed.  All starting workloads were established based on the results of the 6 minute walk test done at initial orientation visit.  The plan for exercise progression was also introduced and progression will be customized based on patient's performance and goals.          Exercise Goals and Review: Exercise Goals    Row Name 03/20/18 1433             Exercise Goals   Increase Physical Activity  Yes       Intervention  Provide advice, education, support and counseling about physical activity/exercise needs.;Develop an individualized exercise prescription for aerobic and resistive training based on initial evaluation findings, risk stratification, comorbidities and participant's personal goals.       Expected Outcomes  Short Term: Attend rehab on a regular basis to increase amount of physical activity.;Long Term: Add in home exercise to make exercise part of routine and to increase amount of  physical activity.;Long Term: Exercising regularly at least 3-5 days a week.       Increase Strength and Stamina  Yes       Intervention  Provide advice, education, support and counseling about physical activity/exercise needs.;Develop an individualized exercise prescription for aerobic and resistive training based  on initial evaluation findings, risk stratification, comorbidities and participant's personal goals.       Expected Outcomes  Long Term: Improve cardiorespiratory fitness, muscular endurance and strength as measured by increased METs and functional capacity (6MWT);Short Term: Perform resistance training exercises routinely during rehab and add in resistance training at home;Short Term: Increase workloads from initial exercise prescription for resistance, speed, and METs.       Able to understand and use rate of perceived exertion (RPE) scale  Yes       Intervention  Provide education and explanation on how to use RPE scale       Expected Outcomes  Short Term: Able to use RPE daily in rehab to express subjective intensity level;Long Term:  Able to use RPE to guide intensity level when exercising independently       Knowledge and understanding of Target Heart Rate Range (THRR)  Yes       Intervention  Provide education and explanation of THRR including how the numbers were predicted and where they are located for reference       Expected Outcomes  Short Term: Able to state/look up THRR;Long Term: Able to use THRR to govern intensity when exercising independently;Short Term: Able to use daily as guideline for intensity in rehab       Able to check pulse independently  Yes       Intervention  Provide education and demonstration on how to check pulse in carotid and radial arteries.;Review the importance of being able to check your own pulse for safety during independent exercise       Expected Outcomes  Short Term: Able to explain why pulse checking is important during independent exercise;Long  Term: Able to check pulse independently and accurately       Understanding of Exercise Prescription  Yes       Intervention  Provide education, explanation, and written materials on patient's individual exercise prescription       Expected Outcomes  Short Term: Able to explain program exercise prescription;Long Term: Able to explain home exercise prescription to exercise independently          Exercise Goals Re-Evaluation : Exercise Goals Re-Evaluation    Row Name 03/29/18 0731 04/04/18 1147 04/06/18 1321 04/17/18 1038 05/03/18 1150     Exercise Goal Re-Evaluation   Exercise Goals Review  Able to understand and use rate of perceived exertion (RPE) scale;Knowledge and understanding of Target Heart Rate Range (THRR);Understanding of Exercise Prescription  Increase Physical Activity;Increase Strength and Stamina;Understanding of Exercise Prescription  Increase Physical Activity;Increase Strength and Stamina;Understanding of Exercise Prescription  Increase Physical Activity;Increase Strength and Stamina;Understanding of Exercise Prescription  Increase Physical Activity;Increase Strength and Stamina;Understanding of Exercise Prescription   Comments  Reviewed RPE scale, THR and program prescription with pt today.  Pt voiced understanding and was given a copy of goals to take home.   Eliab has completed two full days of exercise.  Since we are currently closed, we have sent out his home exercise guidelines in the mail and will be doing follow up phone calls.    Called to review home exercise with Jerimey over the phone.   Reviewed his target heart rate with him.  He has been walking some but hasn't been out today since it has been raining off and on.  Encouraged him to use our videos for the chair exercises and to repeat it twice to get his full 30 min.   Spoke with Wai's wife.  They did  get their packet and he is getting in some exercise.  He is not doing everything but some things. He is trying to stay  active by working around the house and out in the yard.    Spoke with Kaheem's wife.  He continues to walk some and getting outside.  He does spend a lot of time in his reclinder and was encouraged to limit to no more than two hours at a time.  We also talked about using the chair exercise videos to keep him active.  She noted that Marcelus has been having problems with his knees when walking. We talked about using ice after walking and using his walker for more support and helping his knee.  She will continue to work with him on his walking.    Expected Outcomes  Short: Use RPE daily to regulate intensity. Long: Follow program prescription in THR.  Short: Start to walk and follow videos at home.  Long: Continue to increase strength and stamina.   Short: Use chair exercises and walk at home.  Long: Continue to build strength and stamina.   Short: Walk more.  Long: Continue to build up activity levels.   Short: Continue to walk.  Long: Continue to build up activity.    [place] Name 05/26/18 1018 07/15/18 0733           Exercise Goal Re-Evaluation   Exercise Goals Review  Increase Physical Activity;Increase Strength and Stamina;Understanding of Exercise Prescription  Increase Physical Activity;Increase Strength and Stamina;Understanding of Exercise Prescription      Comments  Spoke with Ranulfo's wife. 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  He would like to do more, but is limited by chest pain.  They think it is from surgery still.  He has not been able to use our videos due to their spotty internet. He is doing the best he can. He will hopefully do better once we get back to class.   Franke is doing okay at home.  He is feeling good and walking some.  He is not quite ready to return to rehab for safety reasons.      Expected Outcomes  Short: Continue to try to walk more  Long: Continue to increase activity  Short: Continue to try to walk more  Long: Continue to increase  activity         Discharge Exercise Prescription (Final Exercise Prescription Changes): Exercise Prescription Changes - 04/04/18 1300      Response to Exercise   Blood Pressure (Admit)  140/76    Blood Pressure (Exercise)  132/76    Blood Pressure (Exit)  128/86    Heart Rate (Admit)  112 bpm    Heart Rate (Exercise)  124 bpm    Heart Rate (Exit)  96 bpm    Rating of Perceived Exertion (Exercise)  13    Comments  second full day of exercise    Duration  Progress to 45 minutes of aerobic exercise without signs/symptoms of physical distress    Intensity  THRR unchanged      Progression   Progression  Continue to progress workloads to maintain intensity without signs/symptoms of physical distress.    Average METs  1.63      Resistance Training   Training Prescription  Yes    Weight  3 lbs    Reps  10-15      Interval Training   Interval Training  No  Treadmill   MPH  0.9    Grade  0    Minutes  15    METs  1.7      Recumbant Elliptical   Level  1    Minutes  15    METs  1.3      T5 Nustep   Level  1    Minutes  15    METs  1.9      Home Exercise Plan   Plans to continue exercise at  Home (comment)   walking   Frequency  Add 2 additional days to program exercise sessions.    Initial Home Exercises Provided  04/04/18   sent via mail      Nutrition:  Target Goals: Understanding of nutrition guidelines, daily intake of sodium <1536m, cholesterol <2035m calories 30% from fat and 7% or less from saturated fats, daily to have 5 or more servings of fruits and vegetables.  Biometrics: Pre Biometrics - 03/20/18 1434      Pre Biometrics   Height  5' 8.9" (1.75 m)    Weight  (!) 334 lb 14.4 oz (151.9 kg)    Waist Circumference  52 inches    Hip Circumference  58 inches    Waist to Hip Ratio  0.9 %    BMI (Calculated)  49.6    Single Leg Stand  334.9 seconds        Nutrition Therapy Plan and Nutrition Goals: Nutrition Therapy & Goals - 03/20/18 1420       Intervention Plan   Intervention  Prescribe, educate and counsel regarding individualized specific dietary modifications aiming towards targeted core components such as weight, hypertension, lipid management, diabetes, heart failure and other comorbidities.;Nutrition handout(s) given to patient.    Expected Outcomes  Short Term Goal: Understand basic principles of dietary content, such as calories, fat, sodium, cholesterol and nutrients.;Short Term Goal: A plan has been developed with personal nutrition goals set during dietitian appointment.;Long Term Goal: Adherence to prescribed nutrition plan.       Nutrition Assessments: Nutrition Assessments - 03/20/18 1420      MEDFICTS Scores   Pre Score  41       Nutrition Goals Re-Evaluation:   Nutrition Goals Discharge (Final Nutrition Goals Re-Evaluation):   Psychosocial: Target Goals: Acknowledge presence or absence of significant depression and/or stress, maximize coping skills, provide positive support system. Participant is able to verbalize types and ability to use techniques and skills needed for reducing stress and depression.   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 His incision is currently still healing, and his MD said no caring for the animals until it is completely healed. 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 He does notice a huge improvement in his SOB, energy, and sleep habits since getting his valve replacement      Family Dynamics   Good Support System?  Yes   wife     Barriers   Psychosocial barriers to participate in program  There are no identifiable  barriers or psychosocial needs.;The patient should benefit from training in stress management and relaxation.  Screening Interventions   Interventions  Encouraged to exercise;Program counselor consult;Provide feedback about the scores to participant;To provide support and resources with identified psychosocial needs    Expected Outcomes  Short Term goal: Utilizing psychosocial counselor, staff and physician to assist with identification of specific Stressors or current issues interfering with healing process. Setting desired goal for each stressor or current issue identified.;Long Term Goal: Stressors or current issues are controlled or eliminated.;Short Term goal: Identification and review with participant of any Quality of Life or Depression concerns found by scoring the questionnaire.;Long Term goal: The participant improves quality of Life and PHQ9 Scores as seen by post scores and/or verbalization of changes       Quality of Life Scores:  Quality of Life - 03/20/18 1420      Quality of Life   Select  Quality of Life      Quality of Life Scores   Health/Function Pre  21.17 %    Socioeconomic Pre  22.92 %    Psych/Spiritual Pre  23.93 %    Family Pre  22.8 %    GLOBAL Pre  22.32 %      Scores of 19 and below usually indicate a poorer quality of life in these areas.  A difference of  2-3 points is a clinically meaningful difference.  A difference of 2-3 points in the total score of the Quality of Life Index has been associated with significant improvement in overall quality of life, self-image, physical symptoms, and general health in studies assessing change in quality of life.  PHQ-9: Recent Review Flowsheet Data    Depression screen [facility] 2/9 03/20/2018   Decreased Interest 1   Down, Depressed, Hopeless 1   PHQ - 2 Score 2   Altered sleeping 0   Tired, decreased energy 1   Change in appetite 0   Feeling bad or failure about yourself  0   Trouble concentrating 0   Moving slowly  or fidgety/restless 0   Suicidal thoughts 0   PHQ-9 Score 3   Difficult doing work/chores Somewhat difficult     Interpretation of Total Score  Total Score Depression Severity:  1-4 = Minimal depression, 5-9 = Mild depression, 10-14 = Moderate depression, 15-19 = Moderately severe depression, 20-27 = Severe depression   Psychosocial Evaluation and Intervention: Psychosocial Evaluation - 04/05/18 1623      Psychosocial Evaluation & Interventions   Interventions  Encouraged to exercise with the program and follow exercise prescription    Comments  counselor Learta Codding documenting in [place] [facility]'s session: counselor called patient to conduct initial assessment.  Patient reported that his doctor had referred him to the program as it would be beneficial to his general health.  Patient listed his spouse Benjamine Mola and their church family as primary supports.  He did mention that he has degenerative arthritis and has 'painful knees.'  No concerns reported with sleeping or eating.  His stressors include having numbness in his left leg and his mind wanting to do things that his body can't yet manage.    Expected Outcomes  Short term goals: increase energy and stamina, exercise more; long term goals: to be able to resume yardwork and caring for rabbits    Continue Psychosocial Services   Follow up required by staff       Psychosocial Re-Evaluation: Psychosocial Re-Evaluation    [place] Name 05/03/18 4970 05/26/18 1022           Psychosocial Re-Evaluation   Current issues with  Current Stress Concerns  Current Stress Concerns      Comments  Lasalle is doing okay at home. He spends more time in his chair then he should.  They are doing okay on the famr.  They have one of their animals with a small  hairline fracture that is taking a lot of their attention.  Feras also has continued to complain about having some chest pain post surgery.  I have encouraged them to contact the doctor about this to make sure it's  just healing pain and not blockages.  She said they would.   Coal is having a hard time currently.  He just lost his dad.  He is still having some weepy moments.  His wife thinks that it has set him back some.  I have [facility] out to [facility] our mental health counselorHe also contineus to have some chest pain, but they have not talked to the doctor about it yet.       Expected Outcomes  Short: Call doctor about pain.  Long: Continue to care for animals.   Short: Talk with Marianna Fuss about losing dad.  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       Interventions  Encouraged to attend Cardiac Rehabilitation for the exercise  Encouraged to attend Cardiac Rehabilitation for the exercise      Continue Psychosocial Services   Follow up required by staff  Follow up required by staff         Psychosocial Discharge (Final Psychosocial Re-Evaluation): Psychosocial Re-Evaluation - 05/26/18 1022      Psychosocial Re-Evaluation   Current issues with  Current Stress Concerns    Comments  Hernan is having a hard time currently.  He just lost his dad.  He is still having some weepy moments.  His wife thinks that it has set him back some.  I have reached out to [facility] our mental health counselorHe also contineus to have some chest pain, but they have not talked to the doctor about it yet.     Expected Outcomes  Short: Talk with Marianna Fuss about losing dad.  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     Interventions  Encouraged to attend Cardiac Rehabilitation for the exercise    Continue Psychosocial Services   Follow up required by staff       Vocational Rehabilitation: Provide vocational rehab assistance to qualifying candidates.   Vocational Rehab Evaluation & Intervention: Vocational Rehab - 03/20/18 1415      Initial Vocational Rehab Evaluation & Intervention   Assessment shows need for Vocational Rehabilitation  No       Education: Education Goals: Education classes will be provided on  a variety of topics geared toward better understanding of heart health and risk factor modification. Participant will state understanding/return demonstration of topics presented as noted by education test scores.  Learning Barriers/Preferences: Learning Barriers/Preferences - 03/20/18 1412      Learning Barriers/Preferences   Learning Barriers  Exercise Concerns    Learning Preferences  Group Instruction;Individual Instruction;Verbal Instruction;Skilled Demonstration;Written Material;Video       Education Topics:  AED/CPR: - Group verbal and written instruction with the use of models to demonstrate the basic use of the AED with the basic ABC's of resuscitation.   General Nutrition Guidelines/Fats and Fiber: -Group instruction provided by verbal, written material, models and posters to present the general guidelines for heart healthy nutrition. Gives an explanation and review of dietary fats and fiber.  Controlling Sodium/Reading Food Labels: -Group verbal and written material supporting the discussion of sodium use in heart healthy nutrition. Review and explanation with models, verbal and written materials for utilization of the food label.   Cardiac Rehab from 03/29/2018 in [facility] Cardiac and Pulmonary Rehab  Date  03/29/18  Educator  [facility]  Instruction Review Code  1- Verbalizes Understanding      Exercise Physiology & General Exercise Guidelines: - Group verbal and written instruction with models to review the exercise physiology of the cardiovascular system and associated critical values. Provides general exercise guidelines with specific guidelines to those with heart or lung disease.    Aerobic Exercise & Resistance Training: - Gives group verbal and written instruction on the various components of exercise. Focuses on aerobic and resistive training programs and the benefits of this training and how to safely progress through these programs..   Flexibility, Balance, Mind/Body  Relaxation: Provides group verbal/written instruction on the benefits of flexibility and balance training, including mind/body exercise modes such as yoga, pilates and tai chi.  Demonstration and skill practice provided.   Stress and Anxiety: - Provides group verbal and written instruction about the health risks of elevated stress and causes of high stress.  Discuss the correlation between heart/lung disease and anxiety and treatment options. Review healthy ways to manage with stress and anxiety.   Depression: - Provides group verbal and written instruction on the correlation between heart/lung disease and depressed mood, treatment options, and the stigmas associated with seeking treatment.   Anatomy & Physiology of the Heart: - Group verbal and written instruction and models provide basic cardiac anatomy and physiology, with the coronary electrical and arterial systems. Review of Valvular disease and Heart Failure   Cardiac Procedures: - Group verbal and written instruction to review commonly prescribed medications for heart disease. Reviews the medication, class of the drug, and side effects. Includes the steps to properly store meds and maintain the prescription regimen. (beta blockers and nitrates)   Cardiac Medications I: - Group verbal and written instruction to review commonly prescribed medications for heart disease. Reviews the medication, class of the drug, and side effects. Includes the steps to properly store meds and maintain the prescription regimen.   Cardiac Medications II: -Group verbal and written instruction to review commonly prescribed medications for heart disease. Reviews the medication, class of the drug, and side effects. (all other drug classes)    Go Sex-Intimacy & Heart Disease, Get SMART - Goal Setting: - Group verbal and written instruction through game format to discuss heart disease and the return to sexual intimacy. Provides group verbal and written  material to discuss and apply goal setting through the application of the S.M.A.R.T. Method.   Other Matters of the Heart: - Provides group verbal, written materials and models to describe Stable Angina and Peripheral Artery. Includes description of the disease process and treatment options available to the cardiac patient.   Exercise & Equipment Safety: - Individual verbal instruction and demonstration of equipment use and safety with use of the equipment.   Cardiac Rehab from 03/29/2018 in [facility] Cardiac and Pulmonary Rehab  Date  03/20/18  Educator  [facility]  Instruction Review Code  1- Verbalizes Understanding      Infection Prevention: - Provides verbal and written material to individual with discussion of infection control including proper hand washing and proper equipment cleaning during exercise session.   Cardiac Rehab from 03/29/2018 in [facility] Cardiac and Pulmonary Rehab  Date  03/20/18  Educator  [place]  Instruction Review Code  1- Verbalizes Understanding      Falls Prevention: - Provides verbal and written material to individual with discussion of falls prevention and safety.   Cardiac Rehab from 03/29/2018 in [facility] Cardiac and Pulmonary Rehab  Date  03/20/18  Educator  [facility]  Instruction Review Code  1- Verbalizes Understanding      Diabetes: - Individual verbal and written instruction to review signs/symptoms of diabetes, desired ranges of glucose level fasting, after meals and with exercise. Acknowledge that pre and post exercise glucose checks will be done for 3 sessions at entry of program.   Know Your Numbers and Risk Factors: -Group verbal and written instruction about important numbers in your health.  Discussion of what are risk factors and how they play a role in the disease process.  Review of Cholesterol, Blood Pressure, Diabetes, and BMI and the role they play in your overall health.   Sleep Hygiene: -Provides group verbal and written instruction about how sleep can  affect your health.  Define sleep hygiene, discuss sleep cycles and impact of sleep habits. Review good sleep hygiene tips.    Other: -Provides group and verbal instruction on various topics (see comments)   Knowledge Questionnaire Score: Knowledge Questionnaire Score - 03/20/18 1414      Knowledge Questionnaire Score   Pre Score  23/26   correct answers reviewed with Lynann Bologna. Focus on Angina      Core Components/Risk Factors/Patient Goals at Admission: Personal Goals and Risk Factors at Admission - 03/20/18 1411      Core Components/Risk Factors/Patient Goals on Admission    Weight Management  Yes;Obesity;Weight Loss    Intervention  Weight Management: Develop a combined nutrition and exercise program designed to reach desired caloric intake, while maintaining appropriate intake of nutrient and fiber, sodium and fats, and appropriate energy expenditure required for the weight goal.;Weight Management: Provide education and appropriate resources to help participant work on and attain dietary goals.;Weight Management/Obesity: Establish reasonable short term and long term weight goals.;Obesity: Provide education and appropriate resources to help participant work on and attain dietary goals.    Admit Weight  334 lb 12.8 oz (151.9 kg)    Goal Weight: Short Term  330 lb (149.7 kg)    Goal Weight: Long Term  250 lb (113.4 kg)    Expected Outcomes  Short Term: Continue to assess and modify interventions until short term weight is achieved;Long Term: Adherence to nutrition and physical activity/exercise program aimed toward attainment of established weight goal;Weight Loss: Understanding of general recommendations for a balanced deficit meal plan, which promotes 1-2 lb weight loss per week and includes a negative energy balance of [phone] kcal/d;Understanding recommendations for meals to include 15-35% energy as protein, 25-35% energy from fat, 35-60% energy from carbohydrates, less than 288m of  dietary cholesterol, 20-35 gm of total fiber daily;Understanding of distribution of calorie intake throughout the day with the consumption of 4-5 meals/snacks    Heart Failure  Yes    Intervention  Provide a combined exercise and nutrition program that is supplemented with education, support and counseling about heart failure. Directed toward relieving symptoms such as shortness of breath, decreased exercise tolerance, and extremity edema.    Expected Outcomes  Improve functional capacity of life;Short term: Daily weights obtained and reported for increase. Utilizing diuretic protocols set by physician.;Short term: Attendance in program 2-3 days a week with increased exercise capacity. Reported lower sodium intake. Reported increased fruit and vegetable intake. Reports medication compliance.;Long  term: Adoption of self-care skills and reduction of barriers for early signs and symptoms recognition and intervention leading to self-care maintenance.    Hypertension  Yes    Intervention  Provide education on lifestyle modifcations including regular physical activity/exercise, weight management, moderate sodium restriction and increased consumption of fresh fruit, vegetables, and low fat dairy, alcohol moderation, and smoking cessation.;Monitor prescription use compliance.    Expected Outcomes  Short Term: Continued assessment and intervention until BP is < 140/26m HG in hypertensive participants. < 130/8106mHG in hypertensive participants with diabetes, heart failure or chronic kidney disease.;Long Term: Maintenance of blood pressure at goal levels.    Lipids  Yes    Intervention  Provide education and support for participant on nutrition & aerobic/resistive exercise along with prescribed medications to achieve LDL <7027mHDL >22m63m  Expected Outcomes  Short Term: Participant states understanding of desired cholesterol values and is compliant with medications prescribed. Participant is following exercise  prescription and nutrition guidelines.;Long Term: Cholesterol controlled with medications as prescribed, with individualized exercise RX and with personalized nutrition plan. Value goals: LDL < 70mg46mL > 40 mg.       Core Components/Risk Factors/Patient Goals Review:  Goals and Risk Factor Review    Row Name 04/17/18 1039 05/03/18 1203 05/26/18 1029         Core Components/Risk Factors/Patient Goals Review   Personal Goals Review  Weight Management/Obesity;Heart Failure  Weight Management/Obesity;Heart Failure  Weight Management/Obesity;Heart Failure;Hypertension     Review  Spoke with Yadiel's wife today. Overall he is doing well.  There biggest concern was that his weight is up some and his legs are still swelling.  He was encouraged to take an extra dose of Lasix to try to get the fluid off.  We also reviewed when to call the doctor about watching his weight and swelling.  They were encouraged to reach out to doctor if he continues to have swelling in his legs and if his weight is up more than 5lbs over the week or 3 lbs in a day.  Spoke with Garrett's wife again today.  Overall he is doing well.  He still has issues with his weight and swelling. They are working to get it down.  He was encouraged to limit chair time to no more than two hours at a time and to get out and work more as able.  He was also encouraged to keep walking and ice knees afterwards.  His pressures have been good and they are happy with them.    Spoke Ryder's wife again today.  He was asleep currently.  His numbers have been doing okay. He continues to have problems with his weight being elevated.  I encouraged them to go for walks to help.      Expected Outcomes  Short: Try extra Lasix to help with weight and call doctor  Long: Continue to manage heart failure.   Short: Continue to work on weight loss. Long: Continue to monitor risk factors.   Short: Continue to work on weighLockheed Martincontact doctor if needed.  Long: Continue to work  on heart failure management.         Core Components/Risk Factors/Patient Goals at Discharge (Final Review):  Goals and Risk Factor Review - 05/26/18 1029      Core Components/Risk Factors/Patient Goals Review   Personal Goals Review  Weight Management/Obesity;Heart Failure;Hypertension    Review  Spoke Eino's wife again today.  He was asleep currently.  His  numbers have been doing okay. He continues to have problems with his weight being elevated.  I encouraged them to go for walks to help.     Expected Outcomes  Short: Continue to work on weight and contact doctor if needed.  Long: Continue to work on heart failure management.        ITP Comments: ITP Comments    Row Name 03/20/18 1337 04/04/18 1147 04/12/18 1154 04/17/18 1037 05/03/18 1148   ITP Comments  Med Review completed. Initial ITP created. Diagnosis can be found in CE 2/17  Our program is currently closed due to COVID-19.  We are communicating with patient via phone calls and emails.    30 day review. Continue with ITP unless directed changes by Medical Director chart review.  Spoke with Javi's wife on phone. She feels that he is doing well.   Spoke with Jeromie's wife again.  Number listed is her work line.  He is doing well.    [place] Name 05/09/18 1135 05/17/18 1005 07/15/18 0732 08/02/18 1301     ITP Comments  Pt will be discharged at this time.  He is now deceased.   Pt called it was his father that had passed away.  He is now reinstated in the program and would like to continue to participate!!  Damarion replied back to email.  He is doing well.  He is not ready to schedule his return to the program yet.  30 day review cycle restarting  after being closed since March 16 because of  Covid 19 pandemic. Program opened to patients on July 6. Not all have returned. ITP updated and sent to Medical Director for review,changes as needed and signature       Comments:

## 2018-09-11 ENCOUNTER — Encounter: Payer: Self-pay | Admitting: *Deleted

## 2018-09-11 DIAGNOSIS — Z952 Presence of prosthetic heart valve: Secondary | ICD-10-CM

## 2018-09-11 NOTE — Progress Notes (Signed)
Cardiac Individual Treatment Plan  Patient Details  Name: Andre Erickson MRN: [id number] Date of Birth: 08-May-1955 Referring Provider:     Cardiac Rehab from 03/20/2018 in [facility] Cardiac and Pulmonary Rehab  Referring Provider  Shade Flood MD      Initial Encounter Date:    Cardiac Rehab from 03/20/2018 in [facility] Cardiac and Pulmonary Rehab  Date  03/20/18      Visit Diagnosis: S/P AVR (aortic valve replacement)  Patient's Home Medications on Admission:  Current Outpatient Medications:  .  albuterol (PROVENTIL HFA;VENTOLIN HFA) 108 (90 Base) MCG/ACT inhaler, Inhale 2 puffs into the lungs every 4 (four) hours as needed for wheezing or shortness of breath., Disp: , Rfl:  .  aspirin 81 MG tablet, Take 81 mg by mouth daily. , Disp: , Rfl:  .  chlorhexidine (PERIDEX) 0.12 % solution, Rinse with 15 mls twice daily for 30 seconds. Use after breakfast and at bedtime. Spit out excess. Do not swallow. (Patient not taking: Reported on 03/20/2018), Disp: 480 mL, Rfl: prn .  Cholecalciferol (VITAMIN D-3) 5000 units TABS, Take 5,000 Units by mouth daily., Disp: , Rfl:  .  diclofenac (VOLTAREN) 75 MG EC tablet, Take 75 mg by mouth daily. , Disp: , Rfl:  .  DULoxetine (CYMBALTA) 60 MG capsule, Take 60 mg by mouth daily., Disp: , Rfl:  .  furosemide (LASIX) 40 MG tablet, Take 1 tablet (40 mg total) by mouth 2 (two) times daily., Disp: 180 tablet, Rfl: 1 .  gabapentin (NEURONTIN) 300 MG capsule, Take 300 mg by mouth 2 (two) times daily as needed (pain). , Disp: , Rfl:  .  Ipratropium-Albuterol (COMBIVENT) 20-100 MCG/ACT AERS respimat, Inhale into the lungs., Disp: , Rfl:  .  potassium chloride SA (K-DUR,KLOR-CON) 20 MEQ tablet, Take 1 tablet (20 mEq total) by mouth daily., Disp: 90 tablet, Rfl: 1 .  pravastatin (PRAVACHOL) 40 MG tablet, Take 40 mg by mouth daily., Disp: , Rfl:   Past Medical History: Past Medical History:  Diagnosis Date  . Aortic valve stenosis   . Arthritis   . Asthma    as a  child  . GERD (gastroesophageal reflux disease)   . Hyperlipidemia   . Hypertension   . Morbid obesity ([place])   . Murmur   . Sleep apnea    trying to use the CPAP    Tobacco Use: Social History   Tobacco Use  Smoking Status Former Smoker  . Quit date: 09/27/1978  . Years since quitting: 39.9  Smokeless Tobacco Never Used    Labs: Recent Review Flowsheet Data    There is no flowsheet data to display.       Exercise Target Goals: Exercise Program Goal: Individual exercise prescription set using results from initial 6 min walk test and THRR while considering  patient's activity barriers and safety.   Exercise Prescription Goal: Initial exercise prescription builds to 30-45 minutes a day of aerobic activity, 2-3 days per week.  Home exercise guidelines will be given to patient during program as part of exercise prescription that the participant will acknowledge.  Activity Barriers & Risk Stratification: Activity Barriers & Cardiac Risk Stratification - 03/20/18 1423      Activity Barriers & Cardiac Risk Stratification   Activity Barriers  Deconditioning;Muscular Weakness;Shortness of Breath;Balance Concerns;Assistive Device;Decreased Ventricular Function;Joint Problems;Arthritis   R knee pain, Arth in knees and shoulders, R Frozen Shoulder (limited ROM)   Cardiac Risk Stratification  High       6 Minute Walk:  6 Minute Walk    Row Name 03/20/18 1421         6 Minute Walk   Phase  Initial     Distance  933 feet walks with cane, limps due to R knee     Walk Time  6 minutes     # of Rest Breaks  0     MPH  1.77     METS  2.34     RPE  11     VO2 Peak  8.17     Symptoms  No     Resting HR  84 bpm     Resting BP  132/70     Resting Oxygen Saturation   97 %     Exercise Oxygen Saturation  during 6 min walk  89 %     Max Ex. HR  84 bpm     Max Ex. BP  132/70     2 Minute Post BP  142/72        Oxygen Initial Assessment:   Oxygen Re-Evaluation:   Oxygen  Discharge (Final Oxygen Re-Evaluation):   Initial Exercise Prescription: Initial Exercise Prescription - 03/20/18 1400      Date of Initial Exercise RX and Referring Provider   Date  03/20/18    Referring Provider  Shade Flood MD      Recumbant Elliptical   Level  1    RPM  50    Minutes  15    METs  2.3      T5 Nustep   Level  1    SPM  80    Minutes  15    METs  2.3      Track   Laps  27    Minutes  15    METs  2.23      Prescription Details   Frequency (times per week)  3    Duration  Progress to 45 minutes of aerobic exercise without signs/symptoms of physical distress      Intensity   THRR 40-80% of Max Heartrate  114-144    Ratings of Perceived Exertion  11-13    Perceived Dyspnea  0-4      Progression   Progression  Continue to progress workloads to maintain intensity without signs/symptoms of physical distress.      Resistance Training   Training Prescription  Yes    Weight  3 lbs    Reps  10-15       Perform Capillary Blood Glucose checks as needed.  Exercise Prescription Changes: Exercise Prescription Changes    Row Name 03/20/18 1400 04/04/18 1300           Response to Exercise   Blood Pressure (Admit)  132/70  140/76      Blood Pressure (Exercise)  192/82  132/76      Blood Pressure (Exit)  142/72  128/86      Heart Rate (Admit)  84 bpm  112 bpm      Heart Rate (Exercise)  132 bpm  124 bpm      Heart Rate (Exit)  94 bpm  96 bpm      Oxygen Saturation (Admit)  97 %  -      Oxygen Saturation (Exercise)  89 %  -      Oxygen Saturation (Exit)  95 %  -      Rating of Perceived Exertion (Exercise)  11  13      Symptoms  walking with cane and limp due to R knee  -      Comments  walk test results  second full day of exercise      Duration  -  Progress to 45 minutes of aerobic exercise without signs/symptoms of physical distress      Intensity  -  THRR unchanged        Progression   Progression  -  Continue to progress workloads to maintain  intensity without signs/symptoms of physical distress.      Average METs  -  1.63        Resistance Training   Training Prescription  -  Yes      Weight  -  3 lbs      Reps  -  10-15        Interval Training   Interval Training  -  No        Treadmill   MPH  -  0.9      Grade  -  0      Minutes  -  15      METs  -  1.7        Recumbant Elliptical   Level  -  1      Minutes  -  15      METs  -  1.3        T5 Nustep   Level  -  1      Minutes  -  15      METs  -  1.9        Home Exercise Plan   Plans to continue exercise at  -  Home (comment) walking      Frequency  -  Add 2 additional days to program exercise sessions.      Initial Home Exercises Provided  -  04/04/18 sent via mail         Exercise Comments: Exercise Comments    Row Name 03/29/18 0731           Exercise Comments  First full day of exercise!  Patient was oriented to gym and equipment including functions, settings, policies, and procedures.  Patient's individual exercise prescription and treatment plan were reviewed.  All starting workloads were established based on the results of the 6 minute walk test done at initial orientation visit.  The plan for exercise progression was also introduced and progression will be customized based on patient's performance and goals.          Exercise Goals and Review: Exercise Goals    Row Name 03/20/18 1433             Exercise Goals   Increase Physical Activity  Yes       Intervention  Provide advice, education, support and counseling about physical activity/exercise needs.;Develop an individualized exercise prescription for aerobic and resistive training based on initial evaluation findings, risk stratification, comorbidities and participant's personal goals.       Expected Outcomes  Short Term: Attend rehab on a regular basis to increase amount of physical activity.;Long Term: Add in home exercise to make exercise part of routine and to increase amount of  physical activity.;Long Term: Exercising regularly at least 3-5 days a week.       Increase Strength and Stamina  Yes       Intervention  Provide advice, education, support and counseling about physical activity/exercise needs.;Develop an individualized exercise prescription for aerobic and resistive training based  on initial evaluation findings, risk stratification, comorbidities and participant's personal goals.       Expected Outcomes  Long Term: Improve cardiorespiratory fitness, muscular endurance and strength as measured by increased METs and functional capacity (6MWT);Short Term: Perform resistance training exercises routinely during rehab and add in resistance training at home;Short Term: Increase workloads from initial exercise prescription for resistance, speed, and METs.       Able to understand and use rate of perceived exertion (RPE) scale  Yes       Intervention  Provide education and explanation on how to use RPE scale       Expected Outcomes  Short Term: Able to use RPE daily in rehab to express subjective intensity level;Long Term:  Able to use RPE to guide intensity level when exercising independently       Knowledge and understanding of Target Heart Rate Range (THRR)  Yes       Intervention  Provide education and explanation of THRR including how the numbers were predicted and where they are located for reference       Expected Outcomes  Short Term: Able to state/look up THRR;Long Term: Able to use THRR to govern intensity when exercising independently;Short Term: Able to use daily as guideline for intensity in rehab       Able to check pulse independently  Yes       Intervention  Provide education and demonstration on how to check pulse in carotid and radial arteries.;Review the importance of being able to check your own pulse for safety during independent exercise       Expected Outcomes  Short Term: Able to explain why pulse checking is important during independent exercise;Long  Term: Able to check pulse independently and accurately       Understanding of Exercise Prescription  Yes       Intervention  Provide education, explanation, and written materials on patient's individual exercise prescription       Expected Outcomes  Short Term: Able to explain program exercise prescription;Long Term: Able to explain home exercise prescription to exercise independently          Exercise Goals Re-Evaluation : Exercise Goals Re-Evaluation    Row Name 03/29/18 0731 04/04/18 1147 04/06/18 1321 04/17/18 1038 05/03/18 1150     Exercise Goal Re-Evaluation   Exercise Goals Review  Able to understand and use rate of perceived exertion (RPE) scale;Knowledge and understanding of Target Heart Rate Range (THRR);Understanding of Exercise Prescription  Increase Physical Activity;Increase Strength and Stamina;Understanding of Exercise Prescription  Increase Physical Activity;Increase Strength and Stamina;Understanding of Exercise Prescription  Increase Physical Activity;Increase Strength and Stamina;Understanding of Exercise Prescription  Increase Physical Activity;Increase Strength and Stamina;Understanding of Exercise Prescription   Comments  Reviewed RPE scale, THR and program prescription with pt today.  Pt voiced understanding and was given a copy of goals to take home.   Andre Erickson has completed two full days of exercise.  Since we are currently closed, we have sent out his home exercise guidelines in the mail and will be doing follow up phone calls.    Called to review home exercise with Andre Erickson over the phone.   Reviewed his target heart rate with him.  He has been walking some but hasn't been out today since it has been raining off and on.  Encouraged him to use our videos for the chair exercises and to repeat it twice to get his full 30 min.   Spoke with Andre Erickson's wife.  They did  get their packet and he is getting in some exercise.  He is not doing everything but some things. He is trying to stay  active by working around the house and out in the yard.    Spoke with Andre Erickson's wife.  He continues to walk some and getting outside.  He does spend a lot of time in his reclinder and was encouraged to limit to no more than two hours at a time.  We also talked about using the chair exercise videos to keep him active.  She noted that Andre Erickson has been having problems with his knees when walking. We talked about using ice after walking and using his walker for more support and helping his knee.  She will continue to work with him on his walking.    Expected Outcomes  Short: Use RPE daily to regulate intensity. Long: Follow program prescription in THR.  Short: Start to walk and follow videos at home.  Long: Continue to increase strength and stamina.   Short: Use chair exercises and walk at home.  Long: Continue to build strength and stamina.   Short: Walk more.  Long: Continue to build up activity levels.   Short: Continue to walk.  Long: Continue to build up activity.    Andre Erickson Name 05/26/18 1018 07/15/18 0733           Exercise Goal Re-Evaluation   Exercise Goals Review  Increase Physical Activity;Increase Strength and Stamina;Understanding of Exercise Prescription  Increase Physical Activity;Increase Strength and Stamina;Understanding of Exercise Prescription      Comments  Spoke with Andre Erickson's wife. 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  He would like to do more, but is limited by chest pain.  They think it is from surgery still.  He has not been able to use our videos due to their spotty internet. He is doing the best he can. He will hopefully do better once we get back to class.   Andre Erickson is doing okay at home.  He is feeling good and walking some.  He is not quite ready to return to rehab for safety reasons.      Expected Outcomes  Short: Continue to try to walk more  Long: Continue to increase activity  Short: Continue to try to walk more  Long: Continue to increase  activity         Discharge Exercise Prescription (Final Exercise Prescription Changes): Exercise Prescription Changes - 04/04/18 1300      Response to Exercise   Blood Pressure (Admit)  140/76    Blood Pressure (Exercise)  132/76    Blood Pressure (Exit)  128/86    Heart Rate (Admit)  112 bpm    Heart Rate (Exercise)  124 bpm    Heart Rate (Exit)  96 bpm    Rating of Perceived Exertion (Exercise)  13    Comments  second full day of exercise    Duration  Progress to 45 minutes of aerobic exercise without signs/symptoms of physical distress    Intensity  THRR unchanged      Progression   Progression  Continue to progress workloads to maintain intensity without signs/symptoms of physical distress.    Average METs  1.63      Resistance Training   Training Prescription  Yes    Weight  3 lbs    Reps  10-15      Interval Training   Interval Training  No  Treadmill   MPH  0.9    Grade  0    Minutes  15    METs  1.7      Recumbant Elliptical   Level  1    Minutes  15    METs  1.3      T5 Nustep   Level  1    Minutes  15    METs  1.9      Home Exercise Plan   Plans to continue exercise at  Home (comment)   walking   Frequency  Add 2 additional days to program exercise sessions.    Initial Home Exercises Provided  04/04/18   sent via mail      Nutrition:  Target Goals: Understanding of nutrition guidelines, daily intake of sodium <1536m, cholesterol <2035m calories 30% from fat and 7% or less from saturated fats, daily to have 5 or more servings of fruits and vegetables.  Biometrics: Pre Biometrics - 03/20/18 1434      Pre Biometrics   Height  5' 8.9" (1.75 m)    Weight  (!) 334 lb 14.4 oz (151.9 kg)    Waist Circumference  52 inches    Hip Circumference  58 inches    Waist to Hip Ratio  0.9 %    BMI (Calculated)  49.6    Single Leg Stand  334.9 seconds        Nutrition Therapy Plan and Nutrition Goals: Nutrition Therapy & Goals - 03/20/18 1420       Intervention Plan   Intervention  Prescribe, educate and counsel regarding individualized specific dietary modifications aiming towards targeted core components such as weight, hypertension, lipid management, diabetes, heart failure and other comorbidities.;Nutrition handout(s) given to patient.    Expected Outcomes  Short Term Goal: Understand basic principles of dietary content, such as calories, fat, sodium, cholesterol and nutrients.;Short Term Goal: A plan has been developed with personal nutrition goals set during dietitian appointment.;Long Term Goal: Adherence to prescribed nutrition plan.       Nutrition Assessments: Nutrition Assessments - 03/20/18 1420      MEDFICTS Scores   Pre Score  41       Nutrition Goals Re-Evaluation:   Nutrition Goals Discharge (Final Nutrition Goals Re-Evaluation):   Psychosocial: Target Goals: Acknowledge presence or absence of significant depression and/or stress, maximize coping skills, provide positive support system. Participant is able to verbalize types and ability to use techniques and skills needed for reducing stress and depression.   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 His incision is currently still healing, and his MD said no caring for the animals until it is completely healed. 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 He does notice a huge improvement in his SOB, energy, and sleep habits since getting his valve replacement      Family Dynamics   Good Support System?  Yes   wife     Barriers   Psychosocial barriers to participate in program  There are no identifiable  barriers or psychosocial needs.;The patient should benefit from training in stress management and relaxation.  Screening Interventions   Interventions  Encouraged to exercise;Program counselor consult;Provide feedback about the scores to participant;To provide support and resources with identified psychosocial needs    Expected Outcomes  Short Term goal: Utilizing psychosocial counselor, staff and physician to assist with identification of specific Stressors or current issues interfering with healing process. Setting desired goal for each stressor or current issue identified.;Long Term Goal: Stressors or current issues are controlled or eliminated.;Short Term goal: Identification and review with participant of any Quality of Life or Depression concerns found by scoring the questionnaire.;Long Term goal: The participant improves quality of Life and PHQ9 Scores as seen by post scores and/or verbalization of changes       Quality of Life Scores:  Quality of Life - 03/20/18 1420      Quality of Life   Select  Quality of Life      Quality of Life Scores   Health/Function Pre  21.17 %    Socioeconomic Pre  22.92 %    Psych/Spiritual Pre  23.93 %    Family Pre  22.8 %    GLOBAL Pre  22.32 %      Scores of 19 and below usually indicate a poorer quality of life in these areas.  A difference of  2-3 points is a clinically meaningful difference.  A difference of 2-3 points in the total score of the Quality of Life Index has been associated with significant improvement in overall quality of life, self-image, physical symptoms, and general health in studies assessing change in quality of life.  PHQ-9: Recent Review Flowsheet Data    Depression screen [facility] 2/9 03/20/2018   Decreased Interest 1   Down, Depressed, Hopeless 1   PHQ - 2 Score 2   Altered sleeping 0   Tired, decreased energy 1   Change in appetite 0   Feeling bad or failure about yourself  0   Trouble concentrating 0   Moving slowly  or fidgety/restless 0   Suicidal thoughts 0   PHQ-9 Score 3   Difficult doing work/chores Somewhat difficult     Interpretation of Total Score  Total Score Depression Severity:  1-4 = Minimal depression, 5-9 = Mild depression, 10-14 = Moderate depression, 15-19 = Moderately severe depression, 20-27 = Severe depression   Psychosocial Evaluation and Intervention: Psychosocial Evaluation - 04/05/18 1623      Psychosocial Evaluation & Interventions   Interventions  Encouraged to exercise with the program and follow exercise prescription    Comments  counselor Learta Codding documenting in [facility] session: counselor called patient to conduct initial assessment.  Patient reported that his doctor had referred him to the program as it would be beneficial to his general health.  Patient listed his spouse Benjamine Mola and their church family as primary supports.  He did mention that he has degenerative arthritis and has 'painful knees.'  No concerns reported with sleeping or eating.  His stressors include having numbness in his left leg and his mind wanting to do things that his body can't yet manage.    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 He spends more time in his chair then he should.  They are doing okay on the famr.  They have one of their animals with a small  hairline fracture that is taking a lot of their attention.  Andre Erickson also has continued to complain about having some chest pain post surgery.  I have encouraged them to contact the doctor about this to make sure it's  just healing pain and not blockages.  She said they would.   Andre Erickson is having a hard time currently.  He just lost his dad.  He is still having some weepy moments.  His wife thinks that it has set him back some.  I have [facility] to [facility] our mental health counselorHe also contineus to have some chest pain, but they have not talked to the doctor about it yet.       Expected Outcomes  Short: Call doctor about pain.  Long: Continue to care for animals.   Short: Talk with Marianna Fuss about losing dad.  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       Interventions  Encouraged to attend Cardiac Rehabilitation for the exercise  Encouraged to attend Cardiac Rehabilitation for the exercise      Continue Psychosocial Services   Follow up required by staff  Follow up required by staff         Psychosocial Discharge (Final Psychosocial Re-Evaluation): Psychosocial Re-Evaluation - 05/26/18 1022      Psychosocial Re-Evaluation   Current issues with  Current Stress Concerns    Comments  Andre Erickson is having a hard time currently.  He just lost his dad.  He is still having some weepy moments.  His wife thinks that it has set him back some.  I have reached out to [facility] our mental health counselorHe also contineus to have some chest pain, but they have not talked to the doctor about it yet.     Expected Outcomes  Short: Talk with Marianna Fuss about losing dad.  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     Interventions  Encouraged to attend Cardiac Rehabilitation for the exercise    Continue Psychosocial Services   Follow up required by staff       Vocational Rehabilitation: Provide vocational rehab assistance to qualifying candidates.   Vocational Rehab Evaluation & Intervention: Vocational Rehab - 03/20/18 1415      Initial Vocational Rehab Evaluation & Intervention   Assessment shows need for Vocational Rehabilitation  No       Education: Education Goals: Education classes will be provided on  a variety of topics geared toward better understanding of heart health and risk factor modification. Participant will state understanding/return demonstration of topics presented as noted by education test scores.  Learning Barriers/Preferences: Learning Barriers/Preferences - 03/20/18 1412      Learning Barriers/Preferences   Learning Barriers  Exercise Concerns    Learning Preferences  Group Instruction;Individual Instruction;Verbal Instruction;Skilled Demonstration;Written Material;Video       Education Topics:  AED/CPR: - Group verbal and written instruction with the use of models to demonstrate the basic use of the AED with the basic ABC's of resuscitation.   General Nutrition Guidelines/Fats and Fiber: -Group instruction provided by verbal, written material, models and posters to present the general guidelines for heart healthy nutrition. Gives an explanation and review of dietary fats and fiber.  Controlling Sodium/Reading Food Labels: -Group verbal and written material supporting the discussion of sodium use in heart healthy nutrition. Review and explanation with models, verbal and written materials for utilization of the food label.   Cardiac Rehab from 03/29/2018 in [facility] Cardiac and Pulmonary Rehab  Date  03/29/18  Educator  [facility]  Instruction Review Code  1- Verbalizes Understanding      Exercise Physiology & General Exercise Guidelines: - Group verbal and written instruction with models to review the exercise physiology of the cardiovascular system and associated critical values. Provides general exercise guidelines with specific guidelines to those with heart or lung disease.    Aerobic Exercise & Resistance Training: - Gives group verbal and written instruction on the various components of exercise. Focuses on aerobic and resistive training programs and the benefits of this training and how to safely progress through these programs..   Flexibility, Balance, Mind/Body  Relaxation: Provides group verbal/written instruction on the benefits of flexibility and balance training, including mind/body exercise modes such as yoga, pilates and tai chi.  Demonstration and skill practice provided.   Stress and Anxiety: - Provides group verbal and written instruction about the health risks of elevated stress and causes of high stress.  Discuss the correlation between heart/lung disease and anxiety and treatment options. Review healthy ways to manage with stress and anxiety.   Depression: - Provides group verbal and written instruction on the correlation between heart/lung disease and depressed mood, treatment options, and the stigmas associated with seeking treatment.   Anatomy & Physiology of the Heart: - Group verbal and written instruction and models provide basic cardiac anatomy and physiology, with the coronary electrical and arterial systems. Review of Valvular disease and Heart Failure   Cardiac Procedures: - Group verbal and written instruction to review commonly prescribed medications for heart disease. Reviews the medication, class of the drug, and side effects. Includes the steps to properly store meds and maintain the prescription regimen. (beta blockers and nitrates)   Cardiac Medications I: - Group verbal and written instruction to review commonly prescribed medications for heart disease. Reviews the medication, class of the drug, and side effects. Includes the steps to properly store meds and maintain the prescription regimen.   Cardiac Medications II: -Group verbal and written instruction to review commonly prescribed medications for heart disease. Reviews the medication, class of the drug, and side effects. (all other drug classes)    Go Sex-Intimacy & Heart Disease, Get SMART - Goal Setting: - Group verbal and written instruction through game format to discuss heart disease and the return to sexual intimacy. Provides group verbal and written  material to discuss and apply goal setting through the application of the S.M.A.R.T. Method.   Other Matters of the Heart: - Provides group verbal, written materials and models to describe Stable Angina and Peripheral Artery. Includes description of the disease process and treatment options available to the cardiac patient.   Exercise & Equipment Safety: - Individual verbal instruction and demonstration of equipment use and safety with use of the equipment.   Cardiac Rehab from 03/29/2018 in [facility] Cardiac and Pulmonary Rehab  Date  03/20/18  Educator  [facility]  Instruction Review Code  1- Verbalizes Understanding      Infection Prevention: - Provides verbal and written material to individual with discussion of infection control including proper hand washing and proper equipment cleaning during exercise session.   Cardiac Rehab from 03/29/2018 in [facility] Cardiac and Pulmonary Rehab  Date  03/20/18  Educator  [place]  Instruction Review Code  1- Verbalizes Understanding      Falls Prevention: - Provides verbal and written material to individual with discussion of falls prevention and safety.   Cardiac Rehab from 03/29/2018 in [facility] Cardiac and Pulmonary Rehab  Date  03/20/18  Educator  [facility]  Instruction Review Code  1- Verbalizes Understanding      Diabetes: - Individual verbal and written instruction to review signs/symptoms of diabetes, desired ranges of glucose level fasting, after meals and with exercise. Acknowledge that pre and post exercise glucose checks will be done for 3 sessions at entry of program.   Know Your Numbers and Risk Factors: -Group verbal and written instruction about important numbers in your health.  Discussion of what are risk factors and how they play a role in the disease process.  Review of Cholesterol, Blood Pressure, Diabetes, and BMI and the role they play in your overall health.   Sleep Hygiene: -Provides group verbal and written instruction about how sleep can  affect your health.  Define sleep hygiene, discuss sleep cycles and impact of sleep habits. Review good sleep hygiene tips.    Other: -Provides group and verbal instruction on various topics (see comments)   Knowledge Questionnaire Score: Knowledge Questionnaire Score - 03/20/18 1414      Knowledge Questionnaire Score   Pre Score  23/26   correct answers reviewed with Andre Erickson. Focus on Angina      Core Components/Risk Factors/Patient Goals at Admission: Personal Goals and Risk Factors at Admission - 03/20/18 1411      Core Components/Risk Factors/Patient Goals on Admission    Weight Management  Yes;Obesity;Weight Loss    Intervention  Weight Management: Develop a combined nutrition and exercise program designed to reach desired caloric intake, while maintaining appropriate intake of nutrient and fiber, sodium and fats, and appropriate energy expenditure required for the weight goal.;Weight Management: Provide education and appropriate resources to help participant work on and attain dietary goals.;Weight Management/Obesity: Establish reasonable short term and long term weight goals.;Obesity: Provide education and appropriate resources to help participant work on and attain dietary goals.    Admit Weight  334 lb 12.8 oz (151.9 kg)    Goal Weight: Short Term  330 lb (149.7 kg)    Goal Weight: Long Term  250 lb (113.4 kg)    Expected Outcomes  Short Term: Continue to assess and modify interventions until short term weight is achieved;Long Term: Adherence to nutrition and physical activity/exercise program aimed toward attainment of established weight goal;Weight Loss: Understanding of general recommendations for a balanced deficit meal plan, which promotes 1-2 lb weight loss per week and includes a negative energy balance of [phone] kcal/d;Understanding recommendations for meals to include 15-35% energy as protein, 25-35% energy from fat, 35-60% energy from carbohydrates, less than 288m of  dietary cholesterol, 20-35 gm of total fiber daily;Understanding of distribution of calorie intake throughout the day with the consumption of 4-5 meals/snacks    Heart Failure  Yes    Intervention  Provide a combined exercise and nutrition program that is supplemented with education, support and counseling about heart failure. Directed toward relieving symptoms such as shortness of breath, decreased exercise tolerance, and extremity edema.    Expected Outcomes  Improve functional capacity of life;Short term: Daily weights obtained and reported for increase. Utilizing diuretic protocols set by physician.;Short term: Attendance in program 2-3 days a week with increased exercise capacity. Reported lower sodium intake. Reported increased fruit and vegetable intake. Reports medication compliance.;Long  term: Adoption of self-care skills and reduction of barriers for early signs and symptoms recognition and intervention leading to self-care maintenance.    Hypertension  Yes    Intervention  Provide education on lifestyle modifcations including regular physical activity/exercise, weight management, moderate sodium restriction and increased consumption of fresh fruit, vegetables, and low fat dairy, alcohol moderation, and smoking cessation.;Monitor prescription use compliance.    Expected Outcomes  Short Term: Continued assessment and intervention until BP is < 140/26m HG in hypertensive participants. < 130/8106mHG in hypertensive participants with diabetes, heart failure or chronic kidney disease.;Long Term: Maintenance of blood pressure at goal levels.    Lipids  Yes    Intervention  Provide education and support for participant on nutrition & aerobic/resistive exercise along with prescribed medications to achieve LDL <7027mHDL >22m63m  Expected Outcomes  Short Term: Participant states understanding of desired cholesterol values and is compliant with medications prescribed. Participant is following exercise  prescription and nutrition guidelines.;Long Term: Cholesterol controlled with medications as prescribed, with individualized exercise RX and with personalized nutrition plan. Value goals: LDL < 70mg46mL > 40 mg.       Core Components/Risk Factors/Patient Goals Review:  Goals and Risk Factor Review    Row Name 04/17/18 1039 05/03/18 1203 05/26/18 1029         Core Components/Risk Factors/Patient Goals Review   Personal Goals Review  Weight Management/Obesity;Heart Failure  Weight Management/Obesity;Heart Failure  Weight Management/Obesity;Heart Failure;Hypertension     Review  Spoke with Andre Erickson's wife today. Overall he is doing well.  There biggest concern was that his weight is up some and his legs are still swelling.  He was encouraged to take an extra dose of Lasix to try to get the fluid off.  We also reviewed when to call the doctor about watching his weight and swelling.  They were encouraged to reach out to doctor if he continues to have swelling in his legs and if his weight is up more than 5lbs over the week or 3 lbs in a day.  Spoke with Andre Erickson's wife again today.  Overall he is doing well.  He still has issues with his weight and swelling. They are working to get it down.  He was encouraged to limit chair time to no more than two hours at a time and to get out and work more as able.  He was also encouraged to keep walking and ice knees afterwards.  His pressures have been good and they are happy with them.    Spoke Andre Erickson's wife again today.  He was asleep currently.  His numbers have been doing okay. He continues to have problems with his weight being elevated.  I encouraged them to go for walks to help.      Expected Outcomes  Short: Try extra Lasix to help with weight and call doctor  Long: Continue to manage heart failure.   Short: Continue to work on weight loss. Long: Continue to monitor risk factors.   Short: Continue to work on weighLockheed Martincontact doctor if needed.  Long: Continue to work  on heart failure management.         Core Components/Risk Factors/Patient Goals at Discharge (Final Review):  Goals and Risk Factor Review - 05/26/18 1029      Core Components/Risk Factors/Patient Goals Review   Personal Goals Review  Weight Management/Obesity;Heart Failure;Hypertension    Review  Spoke Andre Erickson's wife again today.  He was asleep currently.  His  numbers have been doing okay. He continues to have problems with his weight being elevated.  I encouraged them to go for walks to help.     Expected Outcomes  Short: Continue to work on weight and contact doctor if needed.  Long: Continue to work on heart failure management.        ITP Comments: ITP Comments    Row Name 03/20/18 1337 04/04/18 1147 04/12/18 1154 04/17/18 1037 05/03/18 1148   ITP Comments  Med Review completed. Initial ITP created. Diagnosis can be found in CE 2/17  Our program is currently closed due to COVID-19.  We are communicating with patient via phone calls and emails.    30 day review. Continue with ITP unless directed changes by Medical Director chart review.  Spoke with Andre Erickson's wife on phone. She feels that he is doing well.   Spoke with Andre Erickson's wife again.  Number listed is her work line.  He is doing well.    [place] [address] Name 05/09/18 1135 05/17/18 1005 07/15/18 0732 08/02/18 1301 09/11/18 1433   ITP Comments  Pt will be discharged at this time.  He is now deceased.   Pt called it was his father that had passed away.  He is now reinstated in the program and would like to continue to participate!!  Fox replied back to email.  He is doing well.  He is not ready to schedule his return to the program yet.  30 day review cycle restarting  after being closed since March 16 because of  Covid 19 pandemic. Program opened to patients on July 6. Not all have returned. ITP updated and sent to Medical Director for review,changes as needed and signature  Andre Erickson is still not comfortable with return to rehab at this time.  We decided to  discharge him now and he will get a new referral to return once things calm down to finish rehab.      Comments: Discharge ITP

## 2018-09-11 NOTE — Progress Notes (Signed)
Discharge Progress Report  Patient Details  Name: Andre Erickson MRN: [id number] Date of Birth: 01-26-1955 Referring Provider:     Cardiac Rehab from 03/20/2018 in [facility]  Referring Provider  Shade Flood MD       Number of Visits: 3/36  Reason for Discharge:  Patient reached a stable level of exercise. Patient independent in their exercise. Patient has met program and personal goals.  Smoking History:  Social History   Tobacco Use  Smoking Status Former Smoker  . Quit date: 09/27/1978  . Years since quitting: 39.9  Smokeless Tobacco Never Used    Diagnosis:  S/P AVR (aortic valve replacement)  ADL UCSD:   Initial Exercise Prescription: Initial Exercise Prescription - 03/20/18 1400      Date of Initial Exercise RX and Referring Provider   Date  03/20/18    Referring Provider  Shade Flood MD      Recumbant Elliptical   Level  1    RPM  50    Minutes  15    METs  2.3      T5 Nustep   Level  1    SPM  80    Minutes  15    METs  2.3      Track   Laps  27    Minutes  15    METs  2.23      Prescription Details   Frequency (times per week)  3    Duration  Progress to 45 minutes of aerobic exercise without signs/symptoms of physical distress      Intensity   THRR 40-80% of Max Heartrate  114-144    Ratings of Perceived Exertion  11-13    Perceived Dyspnea  0-4      Progression   Progression  Continue to progress workloads to maintain intensity without signs/symptoms of physical distress.      Resistance Training   Training Prescription  Yes    Weight  3 lbs    Reps  10-15       Discharge Exercise Prescription (Final Exercise Prescription Changes): Exercise Prescription Changes - 04/04/18 1300      Response to Exercise   Blood Pressure (Admit)  140/76    Blood Pressure (Exercise)  132/76    Blood Pressure (Exit)  128/86    Heart Rate (Admit)  112 bpm    Heart Rate (Exercise)  124 bpm    Heart Rate (Exit)  96 bpm     Rating of Perceived Exertion (Exercise)  13    Comments  second full day of exercise    Duration  Progress to 45 minutes of aerobic exercise without signs/symptoms of physical distress    Intensity  THRR unchanged      Progression   Progression  Continue to progress workloads to maintain intensity without signs/symptoms of physical distress.    Average METs  1.63      Resistance Training   Training Prescription  Yes    Weight  3 lbs    Reps  10-15      Interval Training   Interval Training  No      Treadmill   MPH  0.9    Grade  0    Minutes  15    METs  1.7      Recumbant Elliptical   Level  1    Minutes  15    METs  1.3      T5 Nustep  Level  1    Minutes  15    METs  1.9      Home Exercise Plan   Plans to continue exercise at  Home (comment)   walking   Frequency  Add 2 additional days to program exercise sessions.    Initial Home Exercises Provided  04/04/18   sent via mail      Functional Capacity: [place] Name 03/20/18 1421         6 Minute Walk   Phase  Initial     Distance  933 feet walks with cane, limps due to R knee     Walk Time  6 minutes     # of Rest Breaks  0     MPH  1.77     METS  2.34     RPE  11     VO2 Peak  8.17     Symptoms  No     Resting HR  84 bpm     Resting BP  132/70     Resting Oxygen Saturation   97 %     Exercise Oxygen Saturation  during 6 min walk  89 %     Max Ex. HR  84 bpm     Max Ex. BP  132/70     2 Minute Post BP  142/72        Psychological, QOL, Others - Outcomes: PHQ 2/9: Depression screen PHQ 2/9 03/20/2018  Decreased Interest 1  Down, Depressed, Hopeless 1  PHQ - 2 Score 2  Altered sleeping 0  Tired, decreased energy 1  Change in appetite 0  Feeling bad or failure about yourself  0  Trouble concentrating 0  Moving slowly or fidgety/restless 0  Suicidal thoughts 0  PHQ-9 Score 3  Difficult doing work/chores Somewhat difficult    Quality of Life: Quality of Life - 03/20/18  1420      Quality of Life   Select  Quality of Life      Quality of Life Scores   Health/Function Pre  21.17 %    Socioeconomic Pre  22.92 %    Psych/Spiritual Pre  23.93 %    Family Pre  22.8 %    GLOBAL Pre  22.32 %       Personal Goals: Goals established at orientation with interventions provided to work toward goal. Personal Goals and Risk Factors at Admission - 03/20/18 1411      Core Components/Risk Factors/Patient Goals on Admission    Weight Management  Yes;Obesity;Weight Loss    Intervention  Weight Management: Develop a combined nutrition and exercise program designed to reach desired caloric intake, while maintaining appropriate intake of nutrient and fiber, sodium and fats, and appropriate energy expenditure required for the weight goal.;Weight Management: Provide education and appropriate resources to help participant work on and attain dietary goals.;Weight Management/Obesity: Establish reasonable short term and long term weight goals.;Obesity: Provide education and appropriate resources to help participant work on and attain dietary goals.    Admit Weight  334 lb 12.8 oz (151.9 kg)    Goal Weight: Short Term  330 lb (149.7 kg)    Goal Weight: Long Term  250 lb (113.4 kg)    Expected Outcomes  Short Term: Continue to assess and modify interventions until short term weight is achieved;Long Term: Adherence to nutrition and physical activity/exercise program aimed toward attainment of established weight goal;Weight Loss: Understanding of general recommendations for a  balanced deficit meal plan, which promotes 1-2 lb weight loss per week and includes a negative energy balance of [phone] kcal/d;Understanding recommendations for meals to include 15-35% energy as protein, 25-35% energy from fat, 35-60% energy from carbohydrates, less than 282m of dietary cholesterol, 20-35 gm of total fiber daily;Understanding of distribution of calorie intake throughout the day with the consumption  of 4-5 meals/snacks    Heart Failure  Yes    Intervention  Provide a combined exercise and nutrition program that is supplemented with education, support and counseling about heart failure. Directed toward relieving symptoms such as shortness of breath, decreased exercise tolerance, and extremity edema.    Expected Outcomes  Improve functional capacity of life;Short term: Daily weights obtained and reported for increase. Utilizing diuretic protocols set by physician.;Short term: Attendance in program 2-3 days a week with increased exercise capacity. Reported lower sodium intake. Reported increased fruit and vegetable intake. Reports medication compliance.;Long term: Adoption of self-care skills and reduction of barriers for early signs and symptoms recognition and intervention leading to self-care maintenance.    Hypertension  Yes    Intervention  Provide education on lifestyle modifcations including regular physical activity/exercise, weight management, moderate sodium restriction and increased consumption of fresh fruit, vegetables, and low fat dairy, alcohol moderation, and smoking cessation.;Monitor prescription use compliance.    Expected Outcomes  Short Term: Continued assessment and intervention until BP is < 140/976mHG in hypertensive participants. < 130/8039mG in hypertensive participants with diabetes, heart failure or chronic kidney disease.;Long Term: Maintenance of blood pressure at goal levels.    Lipids  Yes    Intervention  Provide education and support for participant on nutrition & aerobic/resistive exercise along with prescribed medications to achieve LDL <102m18mDL >40mg35m Expected Outcomes  Short Term: Participant states understanding of desired cholesterol values and is compliant with medications prescribed. Participant is following exercise prescription and nutrition guidelines.;Long Term: Cholesterol controlled with medications as prescribed, with individualized exercise RX and  with personalized nutrition plan. Value goals: LDL < 102mg,39m > 40 mg.        Personal Goals Discharge: Goals and Risk Factor Review    Row Name 04/17/18 1039 05/03/18 1203 05/26/18 1029         Core Components/Risk Factors/Patient Goals Review   Personal Goals Review  Weight Management/Obesity;Heart Failure  Weight Management/Obesity;Heart Failure  Weight Management/Obesity;Heart Failure;Hypertension     Review  Spoke with Andre Erickson wife today. Overall he is doing well.  There biggest concern was that his weight is up some and his legs are still swelling.  He was encouraged to take an extra dose of Lasix to try to get the fluid off.  We also reviewed when to call the doctor about watching his weight and swelling.  They were encouraged to reach out to doctor if he continues to have swelling in his legs and if his weight is up more than 5lbs over the week or 3 lbs in a day.  Spoke with Andre Erickson wife again today.  Overall he is doing well.  He still has issues with his weight and swelling. They are working to get it down.  He was encouraged to limit chair time to no more than two hours at a time and to get out and work more as able.  He was also encouraged to keep walking and ice knees afterwards.  His pressures have been good and they are happy with them.    Spoke Andre Erickson wife again  today.  He was asleep currently.  His numbers have been doing okay. He continues to have problems with his weight being elevated.  I encouraged them to go for walks to help.      Expected Outcomes  Short: Try extra Lasix to help with weight and call doctor  Long: Continue to manage heart failure.   Short: Continue to work on weight loss. Long: Continue to monitor risk factors.   Short: Continue to work on [employer] and contact doctor if needed.  Long: Continue to work on heart failure management.         Exercise Goals and Review: Exercise Goals    Row Name 03/20/18 1433             Exercise Goals   Increase Physical  Activity  Yes       Intervention  Provide advice, education, support and counseling about physical activity/exercise needs.;Develop an individualized exercise prescription for aerobic and resistive training based on initial evaluation findings, risk stratification, comorbidities and participant's personal goals.       Expected Outcomes  Short Term: Attend rehab on a regular basis to increase amount of physical activity.;Long Term: Add in home exercise to make exercise part of routine and to increase amount of physical activity.;Long Term: Exercising regularly at least 3-5 days a week.       Increase Strength and Stamina  Yes       Intervention  Provide advice, education, support and counseling about physical activity/exercise needs.;Develop an individualized exercise prescription for aerobic and resistive training based on initial evaluation findings, risk stratification, comorbidities and participant's personal goals.       Expected Outcomes  Long Term: Improve cardiorespiratory fitness, muscular endurance and strength as measured by increased METs and functional capacity (6MWT);Short Term: Perform resistance training exercises routinely during rehab and add in resistance training at home;Short Term: Increase workloads from initial exercise prescription for resistance, speed, and METs.       Able to understand and use rate of perceived exertion (RPE) scale  Yes       Intervention  Provide education and explanation on how to use RPE scale       Expected Outcomes  Short Term: Able to use RPE daily in rehab to express subjective intensity level;Long Term:  Able to use RPE to guide intensity level when exercising independently       Knowledge and understanding of Target Heart Rate Range (THRR)  Yes       Intervention  Provide education and explanation of THRR including how the numbers were predicted and where they are located for reference       Expected Outcomes  Short Term: Able to state/look up THRR;Long  Term: Able to use THRR to govern intensity when exercising independently;Short Term: Able to use daily as guideline for intensity in rehab       Able to check pulse independently  Yes       Intervention  Provide education and demonstration on how to check pulse in carotid and radial arteries.;Review the importance of being able to check your own pulse for safety during independent exercise       Expected Outcomes  Short Term: Able to explain why pulse checking is important during independent exercise;Long Term: Able to check pulse independently and accurately       Understanding of Exercise Prescription  Yes       Intervention  Provide education, explanation, and written materials on patient's individual exercise prescription  Expected Outcomes  Short Term: Able to explain program exercise prescription;Long Term: Able to explain home exercise prescription to exercise independently          Exercise Goals Re-Evaluation: Exercise Goals Re-Evaluation    Row Name 03/29/18 0731 04/04/18 1147 04/06/18 1321 04/17/18 1038 05/03/18 1150     Exercise Goal Re-Evaluation   Exercise Goals Review  Able to understand and use rate of perceived exertion (RPE) scale;Knowledge and understanding of Target Heart Rate Range (THRR);Understanding of Exercise Prescription  Increase Physical Activity;Increase Strength and Stamina;Understanding of Exercise Prescription  Increase Physical Activity;Increase Strength and Stamina;Understanding of Exercise Prescription  Increase Physical Activity;Increase Strength and Stamina;Understanding of Exercise Prescription  Increase Physical Activity;Increase Strength and Stamina;Understanding of Exercise Prescription   Comments  Reviewed RPE scale, THR and program prescription with pt today.  Pt voiced understanding and was given a copy of goals to take home.   Cashawn has completed two full days of exercise.  Since we are currently closed, we have sent out his home exercise guidelines  in the mail and will be doing follow up phone calls.    Called to review home exercise with Prather over the phone.   Reviewed his target heart rate with him.  He has been walking some but hasn't been out today since it has been raining off and on.  Encouraged him to use our videos for the chair exercises and to repeat it twice to get his full 30 min.   Spoke with Andre Erickson wife.  They did get their packet and he is getting in some exercise.  He is not doing everything but some things. He is trying to stay active by working around the house and out in the yard.    Spoke with Andre Erickson wife.  He continues to walk some and getting outside.  He does spend a lot of time in his reclinder and was encouraged to limit to no more than two hours at a time.  We also talked about using the chair exercise videos to keep him active.  She noted that Andre Erickson has been having problems with his knees when walking. We talked about using ice after walking and using his walker for more support and helping his knee.  She will continue to work with him on his walking.    Expected Outcomes  Short: Use RPE daily to regulate intensity. Long: Follow program prescription in THR.  Short: Start to walk and follow videos at home.  Long: Continue to increase strength and stamina.   Short: Use chair exercises and walk at home.  Long: Continue to build strength and stamina.   Short: Walk more.  Long: Continue to build up activity levels.   Short: Continue to walk.  Long: Continue to build up activity.    Andre Erickson Name 05/26/18 1018 07/15/18 0733           Exercise Goal Re-Evaluation   Exercise Goals Review  Increase Physical Activity;Increase Strength and Stamina;Understanding of Exercise Prescription  Increase Physical Activity;Increase Strength and Stamina;Understanding of Exercise Prescription      Comments  Spoke with Andre Erickson wife. 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  He would like to do more, but is  limited by chest pain.  They think it is from surgery still.  He has not been able to use our videos due to their spotty internet. He is doing the best he can. He will hopefully do better once we  get back to class.   [place] is doing okay at home.  He is feeling good and walking some.  He is not quite ready to return to rehab for safety reasons.      Expected Outcomes  Short: Continue to try to walk more  Long: Continue to increase activity  Short: Continue to try to walk more  Long: Continue to increase activity         Nutrition & Weight - Outcomes: Pre Biometrics - 03/20/18 1434      Pre Biometrics   Height  5' 8.9" (1.75 m)    Weight  (!) 334 lb 14.4 oz (151.9 kg)    Waist Circumference  52 inches    Hip Circumference  58 inches    Waist to Hip Ratio  0.9 %    BMI (Calculated)  49.6    Single Leg Stand  334.9 seconds        Nutrition: Nutrition Therapy & Goals - 03/20/18 1420      Intervention Plan   Intervention  Prescribe, educate and counsel regarding individualized specific dietary modifications aiming towards targeted core components such as weight, hypertension, lipid management, diabetes, heart failure and other comorbidities.;Nutrition handout(s) given to patient.    Expected Outcomes  Short Term Goal: Understand basic principles of dietary content, such as calories, fat, sodium, cholesterol and nutrients.;Short Term Goal: A plan has been developed with personal nutrition goals set during dietitian appointment.;Long Term Goal: Adherence to prescribed nutrition plan.       Nutrition Discharge: Nutrition Assessments - 03/20/18 1420      MEDFICTS Scores   Pre Score  41       Education Questionnaire Score: Knowledge Questionnaire Score - 03/20/18 1414      Knowledge Questionnaire Score   Pre Score  23/26   correct answers reviewed with Andre Erickson. Focus on Angina      Goals reviewed with patient; copy given to patient.

## 2018-11-22 ENCOUNTER — Encounter: Payer: Self-pay | Admitting: Emergency Medicine

## 2018-11-22 ENCOUNTER — Other Ambulatory Visit: Payer: Self-pay

## 2018-11-22 ENCOUNTER — Ambulatory Visit: Payer: Medicaid Other

## 2018-11-22 ENCOUNTER — Ambulatory Visit
Admission: EM | Admit: 2018-11-22 | Discharge: 2018-11-22 | Disposition: A | Discharge status: Home / Self Care | Payer: Medicaid Other | Attending: Family Medicine | Admitting: Family Medicine

## 2018-11-22 DIAGNOSIS — Z791 Long term (current) use of non-steroidal anti-inflammatories (NSAID): Secondary | ICD-10-CM | POA: Diagnosis not present

## 2018-11-22 DIAGNOSIS — J45909 Unspecified asthma, uncomplicated: Secondary | ICD-10-CM | POA: Diagnosis not present

## 2018-11-22 DIAGNOSIS — Z83511 Family history of glaucoma: Secondary | ICD-10-CM | POA: Diagnosis not present

## 2018-11-22 DIAGNOSIS — I5020 Unspecified systolic (congestive) heart failure: Secondary | ICD-10-CM | POA: Insufficient documentation

## 2018-11-22 DIAGNOSIS — I110 Hypertensive heart disease with heart failure: Secondary | ICD-10-CM | POA: Diagnosis not present

## 2018-11-22 DIAGNOSIS — E785 Hyperlipidemia, unspecified: Secondary | ICD-10-CM | POA: Insufficient documentation

## 2018-11-22 DIAGNOSIS — S61411A Laceration without foreign body of right hand, initial encounter: Secondary | ICD-10-CM

## 2018-11-22 DIAGNOSIS — E119 Type 2 diabetes mellitus without complications: Secondary | ICD-10-CM | POA: Diagnosis not present

## 2018-11-22 DIAGNOSIS — Z952 Presence of prosthetic heart valve: Secondary | ICD-10-CM | POA: Insufficient documentation

## 2018-11-22 DIAGNOSIS — W540XXA Bitten by dog, initial encounter: Secondary | ICD-10-CM

## 2018-11-22 DIAGNOSIS — Z23 Encounter for immunization: Secondary | ICD-10-CM | POA: Diagnosis not present

## 2018-11-22 DIAGNOSIS — G4730 Sleep apnea, unspecified: Secondary | ICD-10-CM | POA: Diagnosis not present

## 2018-11-22 DIAGNOSIS — Z9049 Acquired absence of other specified parts of digestive tract: Secondary | ICD-10-CM | POA: Diagnosis not present

## 2018-11-22 DIAGNOSIS — Z808 Family history of malignant neoplasm of other organs or systems: Secondary | ICD-10-CM | POA: Diagnosis not present

## 2018-11-22 DIAGNOSIS — Z79899 Other long term (current) drug therapy: Secondary | ICD-10-CM | POA: Diagnosis not present

## 2018-11-22 DIAGNOSIS — M5416 Radiculopathy, lumbar region: Secondary | ICD-10-CM | POA: Insufficient documentation

## 2018-11-22 DIAGNOSIS — Z8059 Family history of malignant neoplasm of other urinary tract organ: Secondary | ICD-10-CM | POA: Diagnosis not present

## 2018-11-22 DIAGNOSIS — Z7982 Long term (current) use of aspirin: Secondary | ICD-10-CM | POA: Insufficient documentation

## 2018-11-22 DIAGNOSIS — K219 Gastro-esophageal reflux disease without esophagitis: Secondary | ICD-10-CM | POA: Insufficient documentation

## 2018-11-22 DIAGNOSIS — Z8249 Family history of ischemic heart disease and other diseases of the circulatory system: Secondary | ICD-10-CM | POA: Diagnosis not present

## 2018-11-22 DIAGNOSIS — Z882 Allergy status to sulfonamides status: Secondary | ICD-10-CM | POA: Insufficient documentation

## 2018-11-22 DIAGNOSIS — Z809 Family history of malignant neoplasm, unspecified: Secondary | ICD-10-CM | POA: Insufficient documentation

## 2018-11-22 DIAGNOSIS — Z800 Family history of malignant neoplasm of digestive organs: Secondary | ICD-10-CM | POA: Insufficient documentation

## 2018-11-22 DIAGNOSIS — M8580 Other specified disorders of bone density and structure, unspecified site: Secondary | ICD-10-CM | POA: Insufficient documentation

## 2018-11-22 DIAGNOSIS — E559 Vitamin D deficiency, unspecified: Secondary | ICD-10-CM | POA: Insufficient documentation

## 2018-11-22 DIAGNOSIS — Z87891 Personal history of nicotine dependence: Secondary | ICD-10-CM | POA: Insufficient documentation

## 2018-11-22 DIAGNOSIS — M1990 Unspecified osteoarthritis, unspecified site: Secondary | ICD-10-CM | POA: Insufficient documentation

## 2018-11-22 MED ORDER — TETANUS-DIPHTH-ACELL PERTUSSIS 5-2.5-18.5 LF-MCG/0.5 IM SUSP
0.5000 mL | Freq: Once | INTRAMUSCULAR | Status: AC
  Administered 2018-11-22: 0.5 mL via INTRAMUSCULAR

## 2018-11-22 MED ORDER — AMOXICILLIN-POT CLAVULANATE 875-125 MG PO TABS: 1.0000 | ORAL_TABLET | Freq: Two times a day (BID) | ORAL | 0 refills | Status: DC

## 2018-11-22 MED ORDER — MUPIROCIN 2 % EX OINT: TOPICAL_OINTMENT | CUTANEOUS | 0 refills | Status: AC

## 2018-11-22 NOTE — Discharge Instructions (Addendum)
Take medication as prescribed. Rest. Elevate. Keep clean with soap and water. Monitor closely.   Follow up with orthopedic (see above) in 2 days.   Follow up at Urgent care on Sunday for wound check, and again in 10 days for suture removal.   Follow up with your primary care physician this week as needed. Return to Urgent care for new or worsening concerns.

## 2018-11-22 NOTE — ED Triage Notes (Addendum)
Patient in today with a dog bite to his right hand that happened ~2 hours ago. Patient states that it was his dog and that the dog is UTD on rabies vaccine. Patient's last tetanus unknown.

## 2018-11-22 NOTE — ED Triage Notes (Signed)
Called and scheduled patient with Dr. Sabra Heck at [facility] on 11/24/18 at 10:30am. Patient aware.

## 2018-11-22 NOTE — ED Provider Notes (Addendum)
[facility] ____________________________________________  Time seen: Approximately 2:33 PM  I have reviewed the triage vital signs and the nursing notes.   HISTORY  Chief Complaint Animal Bite (right hand)  History: Obtained by patient and spouse at bedside by telephone.  HPI Andre Erickson is a 63 y.o. male presenting for evaluation of right hand laceration post dog bite that occurred 2 hours ago while at home.  This is his dog.  Reports he and his wife have had this dog for approximately 1 week as it is a rescue.  Reports he reached to grab the dogs harness and it turned back to biting his hand once then released.  English bulldog approximately 50 pounds.  Reports dog is up-to-date on rabies immunizations.  Unsure of patient's last tetanus immunization.  States mild pain to large laceration site in the dorsal hand, denies any other pain.  Denies any pain radiation, paresthesias, decreased range of motion or decreased strength to right hand.  No recent cough, chest pain or shortness of breath or fevers.  Denies diabetes history.  Denies aggravating factors otherwise.  Reports that is doing well.  Ranae Plumber, [place]: PCP  Past Medical History:  Diagnosis Date   Aortic valve stenosis    Arthritis    Asthma    as a child   GERD (gastroesophageal reflux disease)    Hyperlipidemia    Hypertension    Morbid obesity ([place])    Murmur    Sleep apnea    trying to use the CPAP    Patient Active Problem List   Diagnosis Date Noted   S/P AVR (aortic valve replacement) 02/13/2018   HFrEF (heart failure with reduced ejection fraction) ([place]) 10/21/2017   Severe aortic stenosis 10/20/2017   Other dental procedure status 10/20/2017   Aortic stenosis, severe    Recurrent major depressive disorder in partial remission ([place]) 05/29/2014   Type 2 diabetes mellitus without complications ([place]) 34/19/3790   Cardiac murmur, unspecified 09/03/2013   Radiculopathy  of lumbar region 03/05/2013   Disorder of skin or subcutaneous tissue 10/06/2012   Osteoarthritis 05/10/2012   Vitamin D deficiency 09/14/2011   Essential (primary) hypertension 09/13/2011   Hyperlipidemia 09/13/2011   Obesity 09/13/2011   Tinea pedis 09/13/2011    Past Surgical History:  Procedure Laterality Date   APPENDECTOMY     CARDIAC CATHETERIZATION  09/26/2017   CHOLECYSTECTOMY     IR RADIOLOGY PERIPHERAL GUIDED IV START  10/26/2017   IR US GUIDE VASC ACCESS RIGHT  10/26/2017   MULTIPLE EXTRACTIONS WITH ALVEOLOPLASTY  10/20/2017   MULTIPLE EXTRACTIONS WITH ALVEOLOPLASTY N/A 10/20/2017   Procedure: Extraction of tooth #'s 2,7-10, 12,13,15,20,22,23,25, 26, 28, 29, and 32 with alveoloplasty, bilateral mandibular tori reductions, bilateral maxillary lateral exostoses reductions, and gross debridement of remaining teeth;  Surgeon: Lenn Cal, DDS;  Location: [place];  Service: Oral Surgery;  Laterality: N/A;   tooth removal  10/20/2017     No current facility-administered medications for this encounter.   Current Outpatient Medications:    albuterol (PROVENTIL HFA;VENTOLIN HFA) 108 (90 Base) MCG/ACT inhaler, Inhale 2 puffs into the lungs every 4 (four) hours as needed for wheezing or shortness of breath., Disp: , Rfl:    aspirin 81 MG tablet, Take 81 mg by mouth daily. , Disp: , Rfl:    Cholecalciferol (VITAMIN D-3) 5000 units TABS, Take 5,000 Units by mouth daily., Disp: , Rfl:    diclofenac (VOLTAREN) 75 MG EC tablet, Take 75 mg by mouth  daily. , Disp: , Rfl:    DULoxetine (CYMBALTA) 60 MG capsule, Take 60 mg by mouth daily., Disp: , Rfl:    furosemide (LASIX) 40 MG tablet, Take 1 tablet (40 mg total) by mouth 2 (two) times daily., Disp: 180 tablet, Rfl: 1   gabapentin (NEURONTIN) 300 MG capsule, Take 300 mg by mouth 2 (two) times daily as needed (pain). , Disp: , Rfl:    Ipratropium-Albuterol (COMBIVENT) 20-100 MCG/ACT AERS respimat, Inhale into the  lungs., Disp: , Rfl:    potassium chloride SA (K-DUR,KLOR-CON) 20 MEQ tablet, Take 1 tablet (20 mEq total) by mouth daily., Disp: 90 tablet, Rfl: 1   pravastatin (PRAVACHOL) 40 MG tablet, Take 40 mg by mouth daily., Disp: , Rfl:    amoxicillin-clavulanate (AUGMENTIN) 875-125 MG tablet, Take 1 tablet by mouth every 12 (twelve) hours., Disp: 20 tablet, Rfl: 0   mupirocin ointment (BACTROBAN) 2 %, Apply two times a day for 7 days., Disp: 22 g, Rfl: 0  Allergies Chlorhexidine gluconate  Family History  Problem Relation Age of Onset   Cancer Mother    Colon cancer Father    Rectal cancer Father    Melanoma Father    Atrial fibrillation Father    Macular degeneration Father    Glaucoma Father    Skin cancer Father    CAD Father    Transient ischemic attack Father     Social History Social History   Tobacco Use   Smoking status: Former Smoker    Quit date: 09/27/1978    Years since quitting: 40.1   Smokeless tobacco: Never Used  Substance Use Topics   Alcohol use: Not Currently   Drug use: Never    Review of Systems Constitutional: No fever ENT: No sore throat. Cardiovascular: Denies chest pain. Respiratory: Denies shortness of breath. Gastrointestinal: No abdominal pain.  Musculoskeletal: Positive right hand injury. Skin: Positive right hand laceration. Neurological: Negative for focal weakness or numbness.  ____________________________________________   PHYSICAL EXAM:  VITAL SIGNS: ED Triage Vitals  Enc Vitals Group     BP 11/22/18 1412 (!) 158/88     Pulse Rate 11/22/18 1412 93     Resp 11/22/18 1412 18     Temp 11/22/18 1412 98.2 F (36.8 C)     Temp Source 11/22/18 1412 Oral     SpO2 11/22/18 1412 98 %     Weight 11/22/18 1413 (!) 350 lb (158.8 kg)     Height 11/22/18 1413 5\' 10"  (1.778 m)     Head Circumference --      Peak Flow --      Pain Score 11/22/18 1413 3     Pain Loc --      Pain Edu? --      Excl. in GC? --      Constitutional: Alert and oriented. Well appearing and in no acute distress. Eyes: Conjunctivae are normal.  ENT      Head: Normocephalic and atraumatic. Cardiovascular: Normal rate, regular rhythm. Grossly normal heart sounds.  Good peripheral circulation. Respiratory: Normal respiratory effort without tachypnea nor retractions. Breath sounds are clear and equal bilaterally. No wheezes, rales, rhonchi. Musculoskeletal:  Steady gait.  Neurologic:  Normal speech and language. Speech is normal. No gait instability.  Skin:  Skin is warm, dry.  Except:  Right hand puncture wounds and laceration as depicted below.  Small puncture wound to left distal thumb, without tenderness.  1cm superficial laceration to base of second third phalanx and base of fifth  phalanx without tenderness or gaping, 1.5 cm laceration at the base of third fourth phalanx without tenderness with mild active bleeding.  Dorsal mid hand 6.5 cm laceration present with mild gaping and minimal active bleeding, minimal tenderness to palpation proximal second and third metacarpal, no tendon injury visible.  Right hand with good distal resisted flexion and extension of all fingers, no pain or weakness noted with resisted flexion and extension, normal distal sensation and capillary refill intact.  No foreign bodies noted.  Right wrist nontender.        Post repair image  Psychiatric: Mood and affect are normal. Speech and behavior are normal. Patient exhibits appropriate insight and judgment   ___________________________________________   LABS (all labs ordered are listed, but only abnormal results are displayed)  Labs Reviewed - No data to display  RADIOLOGY  Dg Hand Complete Right  Result Date: 11/22/2018 CLINICAL DATA:  63 year old male with dog bite to the right hand. EXAM: RIGHT HAND - COMPLETE 3+ VIEW COMPARISON:  None. FINDINGS: There is no acute fracture or dislocation. The bones are osteopenic. Severe arthritic  changes of the radiocarpal joint with severe joint space narrowing. Laceration of the soft tissues of the dorsum of the hand. No radiopaque foreign object. IMPRESSION: 1. No acute fracture or dislocation. No radiopaque foreign object. 2. Severe arthritic changes of the radiocarpal joint. 3. Osteopenia. Electronically Signed   By: Elgie Collard M.D.   On: 11/22/2018 14:52   ____________________________________________   PROCEDURES Procedures   Procedure(s) performed:  Procedure explained and verbal consent obtained. Consent: Verbal consent obtained. Written consent not obtained. Risks and benefits: risks, benefits and alternatives were discussed including risk of infection, nonhealing wound, deeper injuries.  Patient verbalized understanding and consented to proceed with repair. Patient identity confirmed: verbally with patient and hospital-assigned identification number  Consent given by: patient   Laceration Repair Location: Right hand Length:6.5cm; 1.5cm Foreign bodies: no foreign bodies Tendon involvement: none Nerve involvement: none Preparation: Patient was prepped and draped in the usual sterile fashion. Anesthesia with 1% Lidocaine 6 mls Cleaned with Betadine Irrigation solution: Sterile water Irrigation method: jet lavage Amount of cleaning: copious Repaired with 6-0 nylon  Number of sutures: 7;1 Technique: simple interrupted  Approximation: loose repair Patient tolerate well. Wound well approximated post repair.  Antibiotic ointment and dressing applied.  Wound care instructions provided.  Observe for any signs of infection or other problems.     INITIAL IMPRESSION / ASSESSMENT AND PLAN / ED COURSE  Pertinent labs & imaging results that were available during my care of the patient were reviewed by me and considered in my medical decision making (see chart for details).  Well-appearing patient.  No acute distress.  Right hand laceration sustained from dog bite that  occurred just prior to arrival.  Reports this is their dog and dog is up-to-date on rabies immunization.  Tetanus immunization updated.  Long conversation had with patient as well as wife on telephone regarding risk and benefits of repairing dog bite lacerations, as well as counseled no obvious bony, tendon or ligament injury on exam however counseled regarding this possibility.  Recommend very close follow-up with orthopedic hand for recheck as well as close wound monitoring, appointment obtained for patient for Friday at 1030am at [facility].  Wound copiously cleaned, irrigated and repaired as above.  Patient tolerated well.  Will place patient on oral Augmentin and topical Bactroban.  Encouraged follow-up with orthopedic in 2 days and wound check again in 3 to  4 days.  Suture removal in 10 days.  Will place on oral Augmentin and topical Bactroban.  Discussed strict follow-up and return parameters.Discussed indication, risks and benefits of medications with patient.  Gunnar Fusiaula CMA called and reported to animal control.  Discussed follow up with Primary care physician this week. Discussed follow up and return parameters including no resolution or any worsening concerns. Patient verbalized understanding and agreed to plan.   ____________________________________________   FINAL CLINICAL IMPRESSION(S) / ED DIAGNOSES  Final diagnoses:  Dog bite, initial encounter  Laceration of right hand without foreign body, initial encounter     ED Discharge Orders         Ordered    amoxicillin-clavulanate (AUGMENTIN) 875-125 MG tablet  Every 12 hours     11/22/18 1544    mupirocin ointment (BACTROBAN) 2 %     11/22/18 1544           Note: This dictation was prepared with Dragon dictation along with smaller phrase technology. Any transcriptional errors that result from this process are unintentional.         Renford DillsMiller, Akeya Ryther, NP 11/22/18 1613    Renford DillsMiller, Wilbert Hayashi, NP 11/22/18 [id number]

## 2018-12-02 ENCOUNTER — Encounter: Payer: Self-pay | Admitting: Emergency Medicine

## 2018-12-02 ENCOUNTER — Other Ambulatory Visit: Payer: Self-pay

## 2018-12-02 ENCOUNTER — Ambulatory Visit
Admission: EM | Admit: 2018-12-02 | Discharge: 2018-12-02 | Disposition: A | Discharge status: Home / Self Care | Payer: Medicaid Other | Attending: Emergency Medicine | Admitting: Emergency Medicine

## 2018-12-02 DIAGNOSIS — S61451D Open bite of right hand, subsequent encounter: Secondary | ICD-10-CM

## 2018-12-02 DIAGNOSIS — W540XXD Bitten by dog, subsequent encounter: Secondary | ICD-10-CM

## 2018-12-02 DIAGNOSIS — Z4802 Encounter for removal of sutures: Secondary | ICD-10-CM

## 2018-12-02 MED ORDER — LIDOCAINE 4 % EX GEL: 1.0000 "application " | Freq: Two times a day (BID) | CUTANEOUS | 0 refills | Status: AC | PRN

## 2018-12-02 NOTE — ED Triage Notes (Signed)
Patient here to have sutures removed from his right hand.  Patient denies any pain.

## 2018-12-02 NOTE — Discharge Instructions (Addendum)
Keep the Steri-Strips clean and dry for 24 to 48 hours.  You may apply the lidocaine to the injury over the knuckle twice a day.  Continue applying the Bactroban to this at night.  Do give it some dry time so that it can continue to scab over.

## 2018-12-02 NOTE — ED Provider Notes (Signed)
HPI  SUBJECTIVE:  Andre Erickson is a 63 y.o. male who presents for suture removal.  He was seen here 10 days ago for a dog bite to his right hand which was sutured.  He was placed on Augmentin which he states he finished.  He was to follow-up with orthopedics, which he did, last Friday and everything was thought to be going well.  He reports itching, particularly at the third MCP joint that has a single suture in it.  No pain, erythema, drainage, body aches, fevers, limitation of motion of the fingers.  No pain with hand movement.  He states that the swelling has significantly improved from the day of initial injury.  He has been taking the Augmentin as directed, been cleaning his hand with Betadine and applying Bactroban at night with improvement in his symptoms.  No aggravating factors.  Past medical history negative for diabetes.  He is not a smoker.  Past Medical History:  Diagnosis Date  . Aortic valve stenosis   . Arthritis   . Asthma    as a child  . GERD (gastroesophageal reflux disease)   . Hyperlipidemia   . Hypertension   . Morbid obesity ([place])   . Murmur   . Sleep apnea    trying to use the CPAP    Past Surgical History:  Procedure Laterality Date  . APPENDECTOMY    . CARDIAC CATHETERIZATION  09/26/2017  . CHOLECYSTECTOMY    . IR RADIOLOGY PERIPHERAL GUIDED IV START  10/26/2017  . IR US GUIDE VASC ACCESS RIGHT  10/26/2017  . MULTIPLE EXTRACTIONS WITH ALVEOLOPLASTY  10/20/2017  . MULTIPLE EXTRACTIONS WITH ALVEOLOPLASTY N/A 10/20/2017   Procedure: Extraction of tooth #'s 2,7-10, 12,13,15,20,22,23,25, 26, 28, 29, and 32 with alveoloplasty, bilateral mandibular tori reductions, bilateral maxillary lateral exostoses reductions, and gross debridement of remaining teeth;  Surgeon: Lenn Cal, DDS;  Location: [facility];  Service: Oral Surgery;  Laterality: N/A;  . tooth removal  10/20/2017    Family History  Problem Relation Age of Onset  . Cancer Mother   . Colon  cancer Father   . Rectal cancer Father   . Melanoma Father   . Atrial fibrillation Father   . Macular degeneration Father   . Glaucoma Father   . Skin cancer Father   . CAD Father   . Transient ischemic attack Father     Social History   Tobacco Use  . Smoking status: Former Smoker    Quit date: 09/27/1978    Years since quitting: 40.2  . Smokeless tobacco: Never Used  Substance Use Topics  . Alcohol use: Not Currently  . Drug use: Never    No current facility-administered medications for this encounter.   Current Outpatient Medications:  .  albuterol (PROVENTIL HFA;VENTOLIN HFA) 108 (90 Base) MCG/ACT inhaler, Inhale 2 puffs into the lungs every 4 (four) hours as needed for wheezing or shortness of breath., Disp: , Rfl:  .  aspirin 81 MG tablet, Take 81 mg by mouth daily. , Disp: , Rfl:  .  Cholecalciferol (VITAMIN D-3) 5000 units TABS, Take 5,000 Units by mouth daily., Disp: , Rfl:  .  diclofenac (VOLTAREN) 75 MG EC tablet, Take 75 mg by mouth daily. , Disp: , Rfl:  .  DULoxetine (CYMBALTA) 60 MG capsule, Take 60 mg by mouth daily., Disp: , Rfl:  .  furosemide (LASIX) 40 MG tablet, Take 1 tablet (40 mg total) by mouth 2 (two) times daily., Disp: 180 tablet,  Rfl: 1 .  gabapentin (NEURONTIN) 300 MG capsule, Take 300 mg by mouth 2 (two) times daily as needed (pain). , Disp: , Rfl:  .  Ipratropium-Albuterol (COMBIVENT) 20-100 MCG/ACT AERS respimat, Inhale into the lungs., Disp: , Rfl:  .  Lidocaine 4 % GEL, Apply 1 application topically 2 (two) times daily as needed. Apply with swab. Use smallest effective amount.. Do not exceed 5 gm., Disp: 10 g, Rfl: 0 .  mupirocin ointment (BACTROBAN) 2 %, Apply two times a day for 7 days., Disp: 22 g, Rfl: 0 .  potassium chloride SA (K-DUR,KLOR-CON) 20 MEQ tablet, Take 1 tablet (20 mEq total) by mouth daily., Disp: 90 tablet, Rfl: 1 .  pravastatin (PRAVACHOL) 40 MG tablet, Take 40 mg by mouth daily., Disp: , Rfl:   Allergies  Allergen Reactions   . Chlorhexidine Gluconate Rash     ROS  As noted in HPI.   Physical Exam  BP (!) 168/89 (BP Location: Left Arm)   Pulse 98   Temp 98.5 F (36.9 C) (Oral)   Resp 16   Ht 5\' 10"  (1.778 m)   Wt (!) 158.8 kg   SpO2 97%   BMI 50.22 kg/m   Constitutional: Well developed, well nourished, no acute distress Eyes:  EOMI, conjunctiva normal bilaterally HENT: Normocephalic, atraumatic,mucus membranes moist Respiratory: Normal inspiratory effort Cardiovascular: Normal rate GI: nondistended skin: No rash, skin intact Musculoskeletal: Scabbed laceration over the hand and third MCP joint.  Several other scattered healing puncture wounds.  No surrounding tenderness, erythema, expressible purulent drainage.  No pain with hand movements, finger movement.  Sensation of all fingers grossly intact.    Neurologic: Alert & oriented x 3, no focal neuro deficits Psychiatric: Speech and behavior appropriate   ED Course   Medications - No data to display  No orders of the defined types were placed in this encounter.   No results found for this or any previous visit (from the past 24 hour(s)). No results found.  ED Clinical Impression  1. Visit for suture removal   2. Dog bite, hand, right, subsequent encounter      ED Assessment/Plan  Appears to be healing well.  No evidence of infection.  Patient has full strength and sensation in all of his fingers.  Procedure note: Cleaned the area with alcohol and iodine.  Removed 7 stitches from the large wound.  Then applied benzoin and Steri-Strips.  Removed 1 stitch from the MCP joint.  Left this open. Patient tolerated procedure well.  Also discussed medical decision-making, treatment plan with wife.  Patient is complaining about significant itching particularly about the knuckle.  We will try some lidocaine gel to see if this does not help.  Explained to patient and spouse that this is part of the natural healing process.  They are to  continue the local wound care and the Bactroban once daily.  Follow-up with PMD as needed.  Meds ordered this encounter  Medications  . Lidocaine 4 % GEL    Sig: Apply 1 application topically 2 (two) times daily as needed. Apply with swab. Use smallest effective amount.. Do not exceed 5 gm.    Dispense:  10 g    Refill:  0    *This clinic note was created using . Therefore, there may be occasional mistakes despite careful proofreading.   ?    Scientist, clinical (histocompatibility and immunogenetics), MD 12/02/18 1700

## 2019-05-23 IMAGING — CT CT ANGIO CHEST
2 of 15 series · 16 of 37 positions shown · IV contrast (APPLIED)
Comparison: Chest CT 08/19/2006. No prior CT the abdomen and
pelvis.

CLINICAL DATA: 61-year-old male with history of severe aortic
stenosis. Preprocedural study prior to potential transcatheter
aortic valve replacement (TAVR) procedure.

EXAM:
CT ANGIOGRAPHY CHEST, ABDOMEN AND PELVIS
TECHNIQUE: Multidetector CT imaging through the chest, abdomen and pelvis was
performed using the standard protocol during bolus administration of
intravenous contrast. Multiplanar reconstructed images and MIPs were
obtained and reviewed to evaluate the vascular anatomy.
CONTRAST:  100mL SDCL2X-EVP IOPAMIDOL (SDCL2X-EVP) INJECTION 76%,
<See Chart> SDCL2X-EVP IOPAMIDOL (SDCL2X-EVP) INJECTION 76%

[Series 8: 0-90% · axial · 0.46mm/px · z∈[+1070,+1264]mm · 8 of 4160 slices shown]
[im 463/4160  lung]
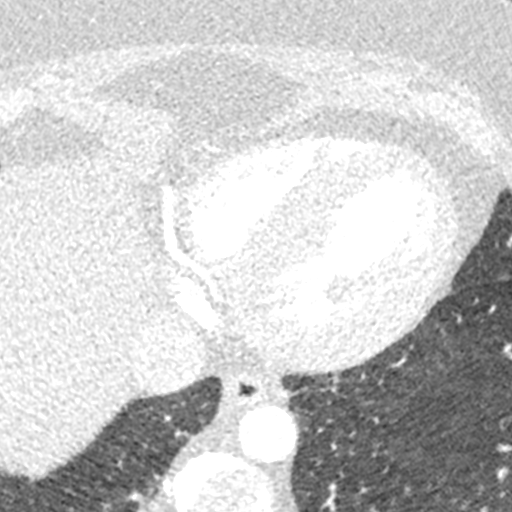
[im 925/4160  mediastinal]
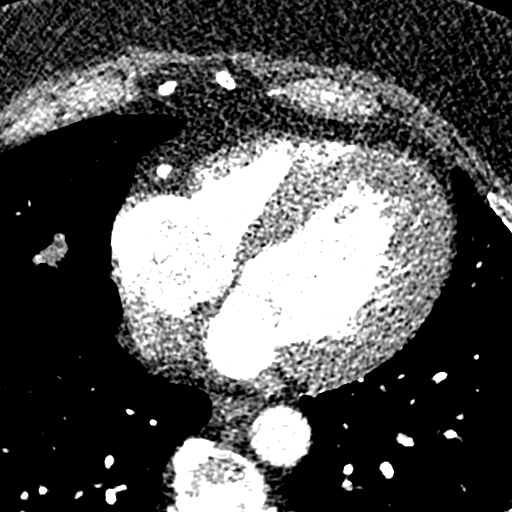
[im 1387/4160  lung]
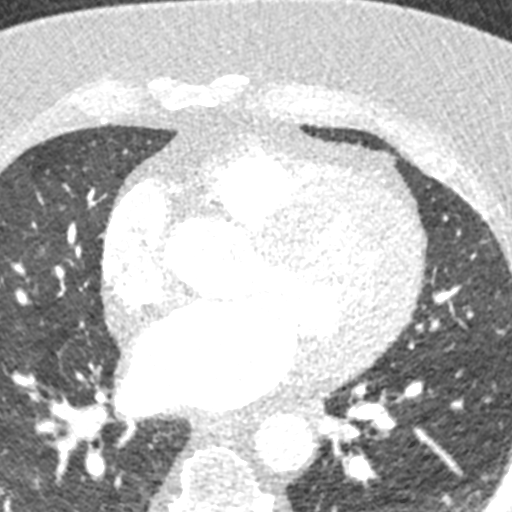
[im 1849/4160  mediastinal]
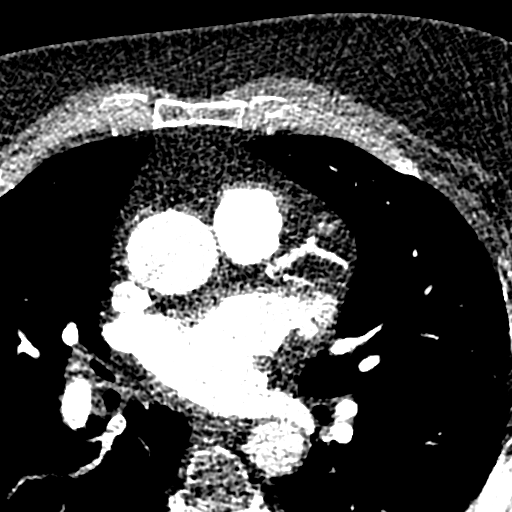
[im 2311/4160  lung]
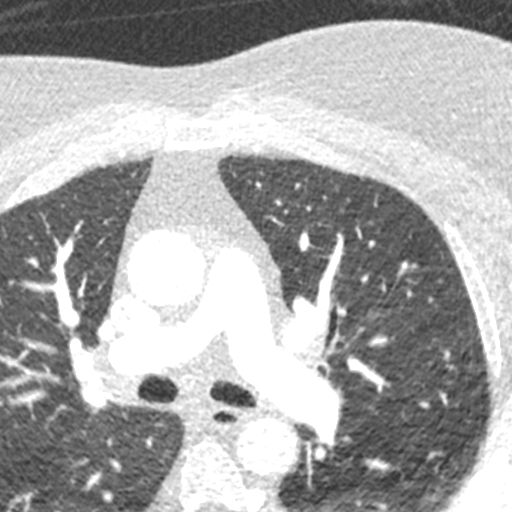
[im 2773/4160  mediastinal]
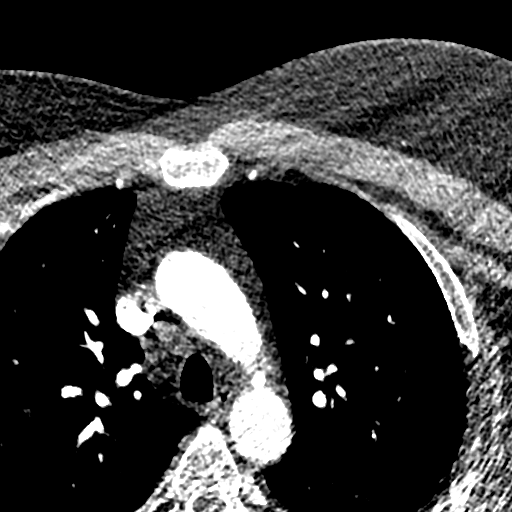
[im 3235/4160  lung]
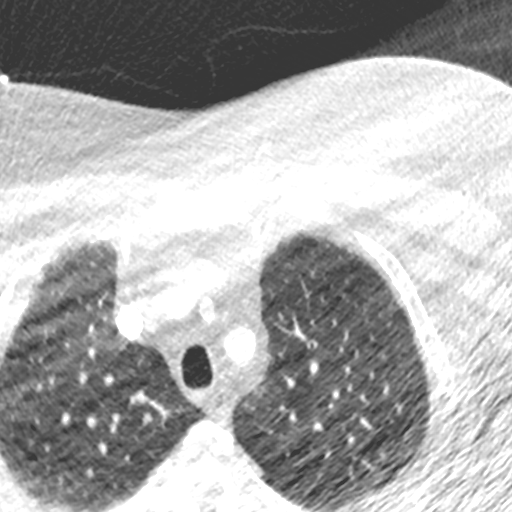
[im 3697/4160  mediastinal]
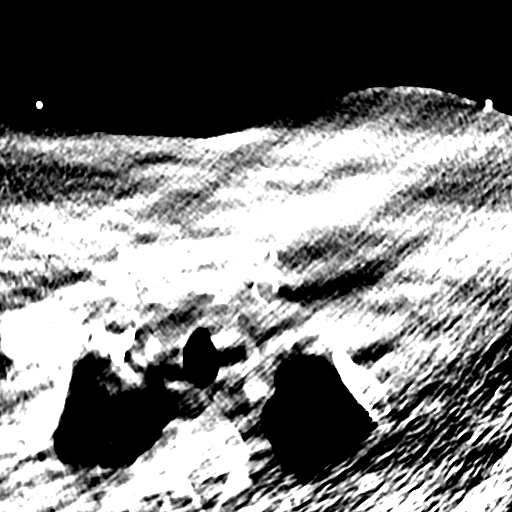

[Series 9: 5-95% · axial · 0.46mm/px · z∈[+1070,+1264]mm · 8 of 4160 slices shown]
[im 463/4160  lung]
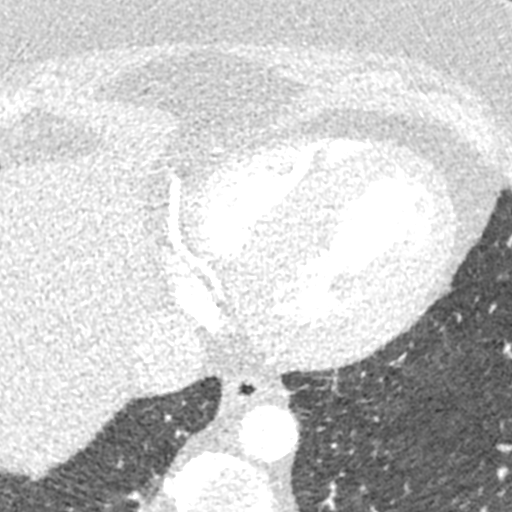
[im 925/4160  lung]
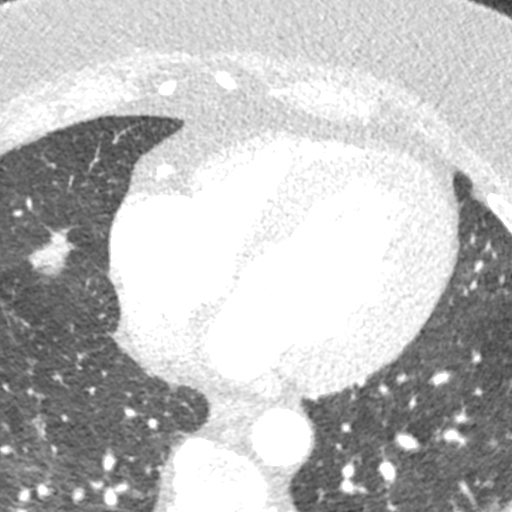
[im 1387/4160  lung]
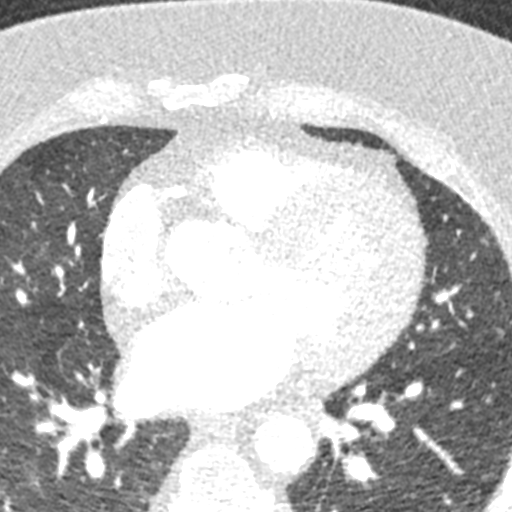
[im 1849/4160  lung]
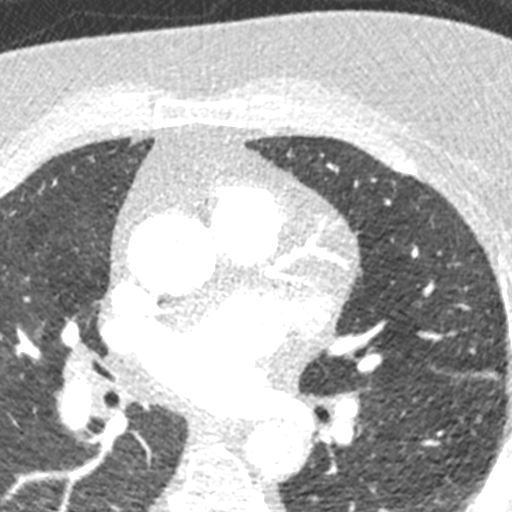
[im 2311/4160  lung]
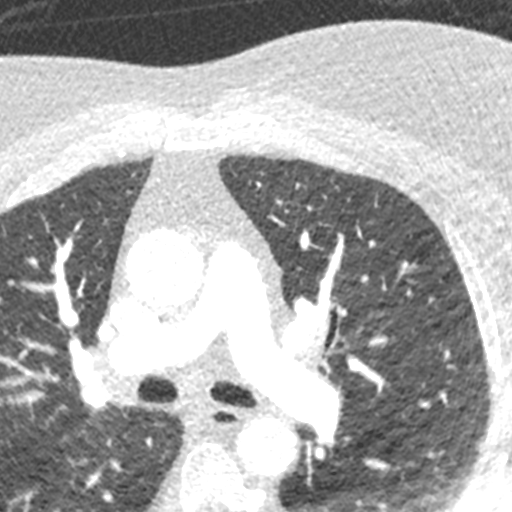
[im 2773/4160  lung]
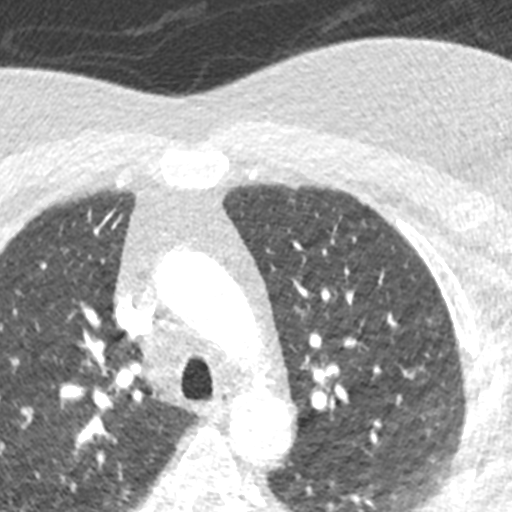
[im 3235/4160  lung]
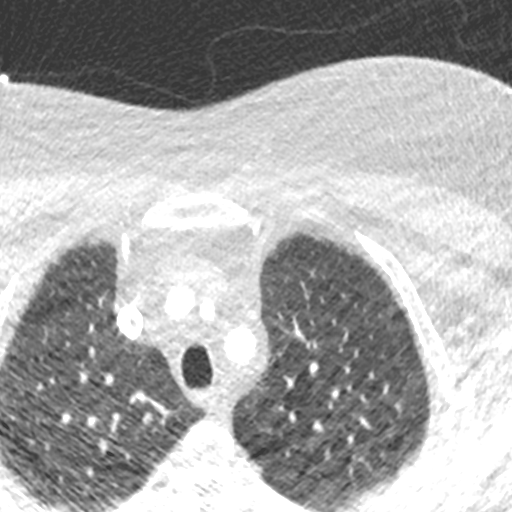
[im 3697/4160  lung]
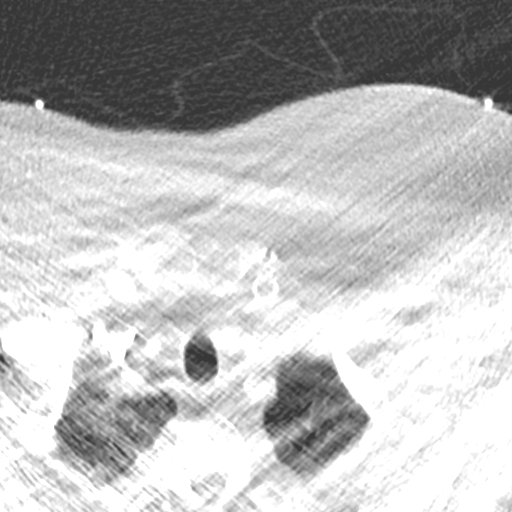

[16 of 37 positions shown; findings below may reference images not displayed]

FINDINGS: CTA CHEST FINDINGS

Cardiovascular: Heart size is mildly enlarged. There is no
significant pericardial fluid, thickening or pericardial
calcification. There is aortic atherosclerosis, as well as
atherosclerosis of the great vessels of the mediastinum and the
coronary arteries, including calcified atherosclerotic plaque in the
left anterior descending and right coronary arteries. Severe
thickening calcification of the aortic valve.

Mediastinum/Lymph Nodes: No pathologically enlarged mediastinal or
hilar lymph nodes. Esophagus is unremarkable in appearance. No
axillary lymphadenopathy.

Lungs/Pleura: Patchy areas of ground-glass attenuation interspersed
with areas of lucency throughout the lungs bilaterally, favored to
reflect air trapping from small airways disease. No acute
consolidative airspace disease. No pleural effusions. No suspicious
appearing pulmonary nodules or masses are noted.

Musculoskeletal/Soft Tissues: There are no aggressive appearing
lytic or blastic lesions noted in the visualized portions of the
skeleton.

CTA ABDOMEN AND PELVIS FINDINGS

Hepatobiliary: Diffuse low attenuation throughout the hepatic
parenchyma, indicative of hepatic steatosis. No suspicious cystic or
solid hepatic lesions. No intra or extrahepatic biliary ductal
dilatation. Status post cholecystectomy.

Pancreas: No pancreatic mass. No pancreatic ductal dilatation. No
pancreatic or peripancreatic fluid or inflammatory changes.

Spleen: Unremarkable.

Adrenals/Urinary Tract: Bilateral kidneys and bilateral adrenal
glands are normal in appearance. No hydroureteronephrosis. Urinary
bladder is normal in appearance.

Stomach/Bowel: Normal appearance of the stomach. No pathologic
dilatation of small bowel or colon. Extensive colonic
diverticulosis, without surrounding inflammatory changes to suggest
an acute diverticulitis at this time. The appendix is not
confidently identified and may be surgically absent. Regardless,
there are no inflammatory changes noted adjacent to the cecum to
suggest the presence of an acute appendicitis at this time.

Vascular/Lymphatic: Aortic atherosclerosis, without evidence of
aneurysm or dissection in the abdominal or pelvic vasculature.
Vascular findings and measurements pertinent to potential TAVR
procedure, as detailed below. No lymphadenopathy noted in the
abdomen or pelvis.

Reproductive: Prostate gland and seminal vesicles are unremarkable
in appearance.

Other: No significant volume of ascites.  No pneumoperitoneum.

Musculoskeletal: There are no aggressive appearing lytic or blastic
lesions noted in the visualized portions of the skeleton.

VASCULAR MEASUREMENTS PERTINENT TO TAVR:

AORTA:

Minimal Aortic Riameter-LX x 18 mm

Severity of Aortic Calcification-moderate

RIGHT PELVIS:

Right Common Iliac Artery -

Minimal 3iameter-TB.B x 11.3 mm

Tortuosity-mild

Calcification-mild-to-moderate

Right External Iliac Artery -

Minimal Xiameter-8.N x 8.3 mm

Tortuosity-severe

Calcification-none

Right Common Femoral Artery -

Minimal Tiameter-J.R x 8.4 mm

Tortuosity-mild

Calcification-mild

LEFT PELVIS:

Left Common Iliac Artery -

Minimal 0iameter-JG.O x 12.1 mm

Tortuosity-mild

Calcification-mild-to-moderate

Left External Iliac Artery -

Minimal Xiameter-8.N x 8.2 mm

Tortuosity-severe

Calcification-none

Left Common Femoral Artery -

Minimal Qiameter-G.N x 8.9 mm

Tortuosity-mild

Calcification-mild

Review of the MIP images confirms the above findings.
IMPRESSION: 1. Vascular findings and measurements pertinent to potential TAVR
procedure, as detailed above.
2. Severe thickening and calcification of the aortic valve,
compatible with the reported clinical history of severe aortic
stenosis.
3. Aortic atherosclerosis, in addition to 2 vessel coronary artery
disease. Please note that although the presence of coronary artery
calcium documents the presence of coronary artery disease, the
severity of this disease and any potential stenosis cannot be
assessed on this non-gated CT examination. Assessment for potential
risk factor modification, dietary therapy or pharmacologic therapy
may be warranted, if clinically indicated.
4. Severe colonic diverticulosis without evidence of acute
diverticulitis at this time.
5. Additional incidental findings, as above.

Aortic Atherosclerosis (2E5I5-WWY.Y).

## 2020-07-25 ENCOUNTER — Encounter: Payer: Self-pay | Admitting: *Deleted

## 2020-07-28 ENCOUNTER — Ambulatory Visit: Payer: Medicaid Other | Admitting: Anesthesiology

## 2020-07-28 ENCOUNTER — Ambulatory Visit
Admission: RE | Admit: 2020-07-28 | Discharge: 2020-07-28 | Disposition: A | Discharge status: Home / Self Care | Payer: Medicaid Other | Attending: Gastroenterology | Admitting: Gastroenterology

## 2020-07-28 ENCOUNTER — Encounter: Payer: Self-pay | Admitting: *Deleted

## 2020-07-28 ENCOUNTER — Encounter
Admission: RE | Disposition: A | Discharge status: Home / Self Care | Payer: Self-pay | Source: Home / Self Care | Attending: Gastroenterology

## 2020-07-28 DIAGNOSIS — Z9049 Acquired absence of other specified parts of digestive tract: Secondary | ICD-10-CM | POA: Insufficient documentation

## 2020-07-28 DIAGNOSIS — Z888 Allergy status to other drugs, medicaments and biological substances status: Secondary | ICD-10-CM | POA: Insufficient documentation

## 2020-07-28 DIAGNOSIS — Z791 Long term (current) use of non-steroidal anti-inflammatories (NSAID): Secondary | ICD-10-CM | POA: Insufficient documentation

## 2020-07-28 DIAGNOSIS — K625 Hemorrhage of anus and rectum: Secondary | ICD-10-CM | POA: Diagnosis present

## 2020-07-28 DIAGNOSIS — K640 First degree hemorrhoids: Secondary | ICD-10-CM | POA: Insufficient documentation

## 2020-07-28 DIAGNOSIS — Z79899 Other long term (current) drug therapy: Secondary | ICD-10-CM | POA: Diagnosis not present

## 2020-07-28 DIAGNOSIS — K633 Ulcer of intestine: Secondary | ICD-10-CM | POA: Insufficient documentation

## 2020-07-28 DIAGNOSIS — K2950 Unspecified chronic gastritis without bleeding: Secondary | ICD-10-CM | POA: Insufficient documentation

## 2020-07-28 DIAGNOSIS — K921 Melena: Secondary | ICD-10-CM | POA: Insufficient documentation

## 2020-07-28 DIAGNOSIS — K5730 Diverticulosis of large intestine without perforation or abscess without bleeding: Secondary | ICD-10-CM | POA: Insufficient documentation

## 2020-07-28 DIAGNOSIS — K2100 Gastro-esophageal reflux disease with esophagitis, without bleeding: Secondary | ICD-10-CM | POA: Insufficient documentation

## 2020-07-28 DIAGNOSIS — Z7982 Long term (current) use of aspirin: Secondary | ICD-10-CM | POA: Insufficient documentation

## 2020-07-28 HISTORY — DX: Depression, unspecified: F32.A

## 2020-07-28 HISTORY — PX: COLONOSCOPY WITH PROPOFOL: SHX5780

## 2020-07-28 HISTORY — PX: ESOPHAGOGASTRODUODENOSCOPY (EGD) WITH PROPOFOL: SHX5813

## 2020-07-28 HISTORY — DX: Heart failure, unspecified: I50.9

## 2020-07-28 SURGERY — ESOPHAGOGASTRODUODENOSCOPY (EGD) WITH PROPOFOL
Anesthesia: General

## 2020-07-28 MED ORDER — PROPOFOL 500 MG/50ML IV EMUL
INTRAVENOUS | Status: DC | PRN
  Administered 2020-07-28: 120 ug/kg/min via INTRAVENOUS

## 2020-07-28 MED ORDER — LIDOCAINE HCL (CARDIAC) PF 100 MG/5ML IV SOSY
PREFILLED_SYRINGE | INTRAVENOUS | Status: DC | PRN
  Administered 2020-07-28: 100 mg via INTRAVENOUS

## 2020-07-28 MED ORDER — PROPOFOL 500 MG/50ML IV EMUL
INTRAVENOUS | Status: AC
  Filled 2020-07-28: qty 50

## 2020-07-28 MED ORDER — PROPOFOL 10 MG/ML IV BOLUS
INTRAVENOUS | Status: AC
  Filled 2020-07-28: qty 20

## 2020-07-28 MED ORDER — SODIUM CHLORIDE 0.9 % IV SOLN: INTRAVENOUS | Status: DC

## 2020-07-28 NOTE — H&P (Signed)
Outpatient short stay form Pre-procedure 07/28/2020 12:11 PM Merlyn Lot MD, MPH  Primary Physician: PA Jesse Sans  Reason for visit:  Rectal bleeding/Melena  History of present illness:   65 y/o gentleman with history of obesity, bioprosthetic aortic valve, and family history of colon cancer in father in his 71's here for EGD/Colonoscopy for melena and rectal bleeding. No blood thinners. History of cholecystectomy and appendectomy.    Current Facility-Administered Medications:    0.9 %  sodium chloride infusion, , Intravenous, Continuous, Selin Eisler, Rossie Muskrat, MD, Last Rate: 20 mL/hr at 07/28/20 1203, New Bag at 07/28/20 1203  Medications Prior to Admission  Medication Sig Dispense Refill Last Dose   albuterol (PROVENTIL HFA;VENTOLIN HFA) 108 (90 Base) MCG/ACT inhaler Inhale 2 puffs into the lungs every 4 (four) hours as needed for wheezing or shortness of breath.   Past Month   aspirin 81 MG tablet Take 81 mg by mouth daily.    07/27/2020   Cholecalciferol (VITAMIN D-3) 5000 units TABS Take 5,000 Units by mouth daily.   07/27/2020   diclofenac (VOLTAREN) 75 MG EC tablet Take 75 mg by mouth daily.    07/27/2020   DULoxetine (CYMBALTA) 60 MG capsule Take 60 mg by mouth daily.   07/27/2020   furosemide (LASIX) 40 MG tablet Take 1 tablet (40 mg total) by mouth 2 (two) times daily. 180 tablet 1 07/27/2020   gabapentin (NEURONTIN) 300 MG capsule Take 300 mg by mouth 2 (two) times daily as needed (pain).    07/27/2020   Ipratropium-Albuterol (COMBIVENT) 20-100 MCG/ACT AERS respimat Inhale into the lungs.   Past Month   Lidocaine 4 % GEL Apply 1 application topically 2 (two) times daily as needed. Apply with swab. Use smallest effective amount.. Do not exceed 5 gm. 10 g 0 Past Month   potassium chloride SA (K-DUR,KLOR-CON) 20 MEQ tablet Take 1 tablet (20 mEq total) by mouth daily. 90 tablet 1 07/27/2020   pravastatin (PRAVACHOL) 40 MG tablet Take 40 mg by mouth daily.   07/27/2020   torsemide (DEMADEX)  20 MG tablet Take 20 mg by mouth daily.   07/27/2020   mupirocin ointment (BACTROBAN) 2 % Apply two times a day for 7 days. (Patient not taking: Reported on 07/28/2020) 22 g 0 Completed Course   spironolactone (ALDACTONE) 25 MG tablet Take 25 mg by mouth daily. (Patient not taking: Reported on 07/28/2020)   Not Taking     Allergies  Allergen Reactions   Chlorhexidine Gluconate Rash     Past Medical History:  Diagnosis Date   Aortic valve stenosis    Arthritis    Asthma    as a child   CHF (congestive heart failure) (HCC)    Depression    GERD (gastroesophageal reflux disease)    Hyperlipidemia    Hypertension    Morbid obesity (HCC)    Murmur    Sleep apnea    trying to use the CPAP    Review of systems:  Otherwise negative.    Physical Exam  Gen: Alert, oriented. Appears stated age.  HEENT: PERRLA. Lungs: No respiratory distress CV: RRR Abd: soft, benign, no masses Ext: No edema    Planned procedures: Proceed with EGD/colonoscopy. The patient understands the nature of the planned procedure, indications, risks, alternatives and potential complications including but not limited to bleeding, infection, perforation, damage to internal organs and possible oversedation/side effects from anesthesia. The patient agrees and gives consent to proceed.  Please refer to procedure notes for findings, recommendations  and patient disposition/instructions.     Merlyn Lot MD, MPH Gastroenterology 07/28/2020  12:11 PM

## 2020-07-28 NOTE — Op Note (Signed)
[facility] Gastroenterology Patient Name: Andre Erickson Procedure Date: 07/28/2020 12:25 PM MRN: [id number] Account #: [id number] Date of Birth: 02-26-1955 Admit Type: Outpatient Age: 65 Room: [facility] ENDO ROOM 3 Gender: Male Note Status: Finalized Procedure:             Upper GI endoscopy Indications:           Melena Providers:             Andrey Farmer MD, MD Referring MD:          No Local Md, MD (Referring MD) Medicines:             Monitored Anesthesia Care Complications:         No immediate complications. Estimated blood loss:                         Minimal. Procedure:             Pre-Anesthesia Assessment:                        - Prior to the procedure, a History and Physical was                         performed, and patient medications and allergies were                         reviewed. The patient is competent. The risks and                         benefits of the procedure and the sedation options and                         risks were discussed with the patient. All questions                         were answered and informed consent was obtained.                         Patient identification and proposed procedure were                         verified by the physician, the nurse, the anesthetist                         and the technician in the endoscopy suite. Mental                         Status Examination: alert and oriented. Airway                         Examination: normal oropharyngeal airway and neck                         mobility. Respiratory Examination: clear to                         auscultation. CV Examination: normal. Prophylactic                         Antibiotics:  The patient does not require prophylactic                         antibiotics. Prior Anticoagulants: The patient has                         taken no previous anticoagulant or antiplatelet                         agents. ASA Grade Assessment: III - A  patient with                         severe systemic disease. After reviewing the risks and                         benefits, the patient was deemed in satisfactory                         condition to undergo the procedure. The anesthesia                         plan was to use monitored anesthesia care (MAC).                         Immediately prior to administration of medications,                         the patient was re-assessed for adequacy to receive                         sedatives. The heart rate, respiratory rate, oxygen                         saturations, blood pressure, adequacy of pulmonary                         ventilation, and response to care were monitored                         throughout the procedure. The physical status of the                         patient was re-assessed after the procedure.                        After obtaining informed consent, the endoscope was                         passed under direct vision. Throughout the procedure,                         the patient's blood pressure, pulse, and oxygen                         saturations were monitored continuously. The Endoscope                         was introduced through the mouth, and advanced to the  second part of duodenum. The upper GI endoscopy was                         accomplished without difficulty. The patient tolerated                         the procedure well. Findings:      LA Grade A (one or more mucosal breaks less than 5 mm, not extending       between tops of 2 mucosal folds) esophagitis with no bleeding was found.      Patchy mild inflammation characterized by erosions and erythema was       found in the gastric antrum. Biopsies were taken with a cold forceps for       Helicobacter pylori testing. Estimated blood loss was minimal.      The examined duodenum was normal. Impression:            - LA Grade A reflux esophagitis with no bleeding.                         - Gastritis. Biopsied.                        - Normal examined duodenum. Recommendation:        - Perform a colonoscopy today. Procedure Code(s):     --- Professional ---                        [phone], Esophagogastroduodenoscopy, flexible,                         transoral; with biopsy, single or multiple Diagnosis Code(s):     --- Professional ---                        K21.00, Gastro-esophageal reflux disease with                         esophagitis, without bleeding                        K29.70, Gastritis, unspecified, without bleeding                        K92.1, Melena (includes Hematochezia) CPT copyright 2019 American Medical Association. All rights reserved. The codes documented in this report are preliminary and upon coder review may  be revised to meet current compliance requirements. Andrey Farmer MD, MD 07/28/2020 12:58:12 PM Number of Addenda: 0 Note Initiated On: 07/28/2020 12:25 PM Estimated Blood Loss:  Estimated blood loss was minimal.      [facility]

## 2020-07-28 NOTE — Op Note (Signed)
[facility] Gastroenterology Patient Name: Andre Erickson Procedure Date: 07/28/2020 12:24 PM MRN: [id number] Account #: [id number] Date of Birth: 1955-07-14 Admit Type: Outpatient Age: 65 Room: [facility] ENDO ROOM 3 Gender: Male Note Status: Finalized Procedure:             Colonoscopy Indications:           Rectal bleeding Providers:             Andrey Farmer MD, MD Referring MD:          No Local Md, MD (Referring MD) Medicines:             Monitored Anesthesia Care Complications:         No immediate complications. Estimated blood loss:                         Minimal. Procedure:             Pre-Anesthesia Assessment:                        - Prior to the procedure, a History and Physical was                         performed, and patient medications and allergies were                         reviewed. The patient is competent. The risks and                         benefits of the procedure and the sedation options and                         risks were discussed with the patient. All questions                         were answered and informed consent was obtained.                         Patient identification and proposed procedure were                         verified by the physician, the nurse, the anesthetist                         and the technician in the endoscopy suite. Mental                         Status Examination: alert and oriented. Airway                         Examination: normal oropharyngeal airway and neck                         mobility. Respiratory Examination: clear to                         auscultation. CV Examination: normal. Prophylactic                         Antibiotics: The  patient does not require prophylactic                         antibiotics. Prior Anticoagulants: The patient has                         taken no previous anticoagulant or antiplatelet                         agents. ASA Grade Assessment: III - A  patient with                         severe systemic disease. After reviewing the risks and                         benefits, the patient was deemed in satisfactory                         condition to undergo the procedure. The anesthesia                         plan was to use monitored anesthesia care (MAC).                         Immediately prior to administration of medications,                         the patient was re-assessed for adequacy to receive                         sedatives. The heart rate, respiratory rate, oxygen                         saturations, blood pressure, adequacy of pulmonary                         ventilation, and response to care were monitored                         throughout the procedure. The physical status of the                         patient was re-assessed after the procedure.                        After obtaining informed consent, the colonoscope was                         passed under direct vision. Throughout the procedure,                         the patient's blood pressure, pulse, and oxygen                         saturations were monitored continuously. The                         Colonoscope was introduced through the anus and  advanced to the the terminal ileum. The colonoscopy                         was performed without difficulty. The patient                         tolerated the procedure well. The quality of the bowel                         preparation was good. Findings:      The perianal and digital rectal examinations were normal.      The terminal ileum appeared normal.      Multiple ulcers were found in the ascending colon and in the cecum. No       bleeding was present. No stigmata of recent bleeding were seen. Biopsies       were taken with a cold forceps for histology. Estimated blood loss was       minimal.      Multiple small and large-mouthed diverticula were found in the sigmoid        colon and descending colon.      Internal hemorrhoids were found during retroflexion. The hemorrhoids       were Grade I (internal hemorrhoids that do not prolapse).      The exam was otherwise without abnormality on direct and retroflexion       views. Impression:            - The examined portion of the ileum was normal.                        - Multiple ulcers in the ascending colon and in the                         cecum. Biopsied.                        - Diverticulosis in the sigmoid colon and in the                         descending colon.                        - Internal hemorrhoids.                        - The examination was otherwise normal on direct and                         retroflexion views. Recommendation:        - Discharge patient to home.                        - Resume previous diet.                        - Continue present medications.                        - Await pathology results.                        - Repeat colonoscopy  in 10 years for screening                         purposes.                        - Return to referring physician as previously                         scheduled. Procedure Code(s):     --- Professional ---                        [id number], Colonoscopy, flexible; with biopsy, single or                         multiple Diagnosis Code(s):     --- Professional ---                        K64.0, First degree hemorrhoids                        K63.3, Ulcer of intestine                        K62.5, Hemorrhage of anus and rectum                        K57.30, Diverticulosis of large intestine without                         perforation or abscess without bleeding CPT copyright 2019 American Medical Association. All rights reserved. The codes documented in this report are preliminary and upon coder review may  be revised to meet current compliance requirements. Andrey Farmer MD, MD 07/28/2020 1:01:49 PM Number of Addenda: 0 Note Initiated  On: 07/28/2020 12:24 PM Scope Withdrawal Time: 0 hours 11 minutes 39 seconds  Total Procedure Duration: 0 hours 15 minutes 22 seconds  Estimated Blood Loss:  Estimated blood loss was minimal.      [facility]

## 2020-07-28 NOTE — Interval H&P Note (Signed)
History and Physical Interval Note:  07/28/2020 12:14 PM  Andre Erickson  has presented today for surgery, with the diagnosis of melana; history of polyps.  The various methods of treatment have been discussed with the patient and family. After consideration of risks, benefits and other options for treatment, the patient has consented to  Procedure(s): ESOPHAGOGASTRODUODENOSCOPY (EGD) WITH PROPOFOL (N/A) COLONOSCOPY WITH PROPOFOL (N/A) as a surgical intervention.  The patient's history has been reviewed, patient examined, no change in status, stable for surgery.  I have reviewed the patient's chart and labs.  Questions were answered to the patient's satisfaction.     Regis Bill  Ok to proceed with EGD/Colonoscopy

## 2020-07-28 NOTE — Anesthesia Procedure Notes (Signed)
Date/Time: 07/28/2020 12:36 PM Performed by: Tonia Ghent Pre-anesthesia Checklist: Patient identified, Emergency Drugs available, Suction available, Patient being monitored and Timeout performed Patient Re-evaluated:Patient Re-evaluated prior to induction Oxygen Delivery Method: Supernova nasal CPAP Preoxygenation: Pre-oxygenation with 100% oxygen Induction Type: IV induction Airway Equipment and Method: Bite block Placement Confirmation: CO2 detector and positive ETCO2

## 2020-07-28 NOTE — Transfer of Care (Signed)
Immediate Anesthesia Transfer of Care Note  Patient: MARTELL MCFADYEN  Procedure(s) Performed: ESOPHAGOGASTRODUODENOSCOPY (EGD) WITH PROPOFOL COLONOSCOPY WITH PROPOFOL  Patient Location: PACU  Anesthesia Type:General  Level of Consciousness: awake and sedated  Airway & Oxygen Therapy: Patient Spontanous Breathing and Patient connected to face mask oxygen  Post-op Assessment: Report given to RN and Post -op Vital signs reviewed and stable  Post vital signs: Reviewed  Last Vitals:  Vitals Value Taken Time  BP    Temp    Pulse 72 07/28/20 1259  Resp 32 07/28/20 1259  SpO2 100 % 07/28/20 1259  Vitals shown include unvalidated device data.  Last Pain:  Vitals:   07/28/20 1146  TempSrc: Tympanic  PainSc: 0-No pain         Complications: No notable events documented.

## 2020-07-28 NOTE — Anesthesia Postprocedure Evaluation (Signed)
Anesthesia Post Note  Patient: Andre Erickson  Procedure(s) Performed: ESOPHAGOGASTRODUODENOSCOPY (EGD) WITH PROPOFOL COLONOSCOPY WITH PROPOFOL  Patient location during evaluation: Endoscopy Anesthesia Type: General Level of consciousness: awake and alert and oriented Pain management: pain level controlled Vital Signs Assessment: post-procedure vital signs reviewed and stable Respiratory status: spontaneous breathing, nonlabored ventilation and respiratory function stable Cardiovascular status: blood pressure returned to baseline and stable Postop Assessment: no signs of nausea or vomiting Anesthetic complications: no   No notable events documented.   Last Vitals:  Vitals:   07/28/20 1311 07/28/20 1322  BP: 126/71 (!) 133/100  Pulse: 78 69  Resp: (!) 24 15  Temp:    SpO2: 96% 98%    Last Pain:  Vitals:   07/28/20 1322  TempSrc:   PainSc: 0-No pain                 Elvena Oyer

## 2020-07-28 NOTE — Anesthesia Preprocedure Evaluation (Signed)
Anesthesia Evaluation  Patient identified by MRN, date of birth, ID band Patient awake    Reviewed: Allergy & Precautions, NPO status , Patient's Chart, lab work & pertinent test results  History of Anesthesia Complications Negative for: history of anesthetic complications  Airway Mallampati: II  TM Distance: >3 FB Neck ROM: Full    Dental  (+) Poor Dentition, Missing   Pulmonary asthma , sleep apnea and Continuous Positive Airway Pressure Ventilation , former smoker,    breath sounds clear to auscultation- rhonchi (-) wheezing      Cardiovascular hypertension, Pt. on medications (-) CAD, (-) Past MI, (-) Cardiac Stents and (-) CABG + Valvular Problems/Murmurs (s/p TAVR)  Rhythm:Regular Rate:Normal - Systolic murmurs and - Diastolic murmurs Echo 04/09/19: NORMAL LEFT VENTRICULAR SYSTOLIC FUNCTION WITH MODERATE LVH  NORMAL LA PRESSURES WITH DIASTOLIC DYSFUNCTION  NORMALRIGHT VENTRICULAR SYSTOLIC FUNCTION  VALVULAR REGURGITATION: TRIVIAL TR  PROSTHETIC VALVE(S): BIOPROSTHETIC AoV  POOR SOUND TRANSMISSION DESPITE USE OF DEFINITY ECHO CONTRAST  INSUFFICIENT TR TO ESTIMATE RVSP    Neuro/Psych neg Seizures PSYCHIATRIC DISORDERS Depression negative neurological ROS     GI/Hepatic Neg liver ROS, GERD  ,  Endo/Other  diabetes  Renal/GU negative Renal ROS     Musculoskeletal  (+) Arthritis ,   Abdominal (+) + obese,   Peds  Hematology negative hematology ROS (+)   Anesthesia Other Findings Past Medical History: No date: Aortic valve stenosis No date: Arthritis No date: Asthma     Comment:  as a child No date: CHF (congestive heart failure) (HCC) No date: Depression No date: GERD (gastroesophageal reflux disease) No date: Hyperlipidemia No date: Hypertension No date: Morbid obesity (HCC) No date: Murmur No date: Sleep apnea     Comment:  trying to use the CPAP   Reproductive/Obstetrics                              Anesthesia Physical Anesthesia Plan  ASA: 3  Anesthesia Plan: General   Post-op Pain Management:    Induction: Intravenous  PONV Risk Score and Plan: 1 and Propofol infusion  Airway Management Planned: Natural Airway  Additional Equipment:   Intra-op Plan:   Post-operative Plan:   Informed Consent: I have reviewed the patients History and Physical, chart, labs and discussed the procedure including the risks, benefits and alternatives for the proposed anesthesia with the patient or authorized representative who has indicated his/her understanding and acceptance.     Dental advisory given  Plan Discussed with: CRNA and Anesthesiologist  Anesthesia Plan Comments:         Anesthesia Quick Evaluation

## 2020-07-29 ENCOUNTER — Encounter: Payer: Self-pay | Admitting: Gastroenterology

## 2020-07-29 LAB — SURGICAL PATHOLOGY

## 2020-08-05 ENCOUNTER — Other Ambulatory Visit: Payer: Self-pay | Admitting: Gastroenterology

## 2020-08-05 DIAGNOSIS — K559 Vascular disorder of intestine, unspecified: Secondary | ICD-10-CM

## 2020-08-22 ENCOUNTER — Ambulatory Visit: Admission: RE | Admit: 2020-08-22 | Payer: Medicaid Other | Source: Ambulatory Visit

## 2020-12-29 DIAGNOSIS — Z23 Encounter for immunization: Secondary | ICD-10-CM | POA: Diagnosis not present
